# Patient Record
Sex: Female | Born: 1945 | Race: White | Hispanic: No | Smoking: Light tobacco smoker
Health system: Southern US, Community
[De-identification: ages and names within clinical notes are randomized; demographics above are authoritative.]

## PROBLEM LIST (undated history)

## (undated) DIAGNOSIS — K219 Gastro-esophageal reflux disease without esophagitis: Secondary | ICD-10-CM

## (undated) DIAGNOSIS — I219 Acute myocardial infarction, unspecified: Secondary | ICD-10-CM

## (undated) DIAGNOSIS — R0602 Shortness of breath: Secondary | ICD-10-CM

## (undated) DIAGNOSIS — T50905A Adverse effect of unspecified drugs, medicaments and biological substances, initial encounter: Secondary | ICD-10-CM

## (undated) DIAGNOSIS — K829 Disease of gallbladder, unspecified: Secondary | ICD-10-CM

## (undated) DIAGNOSIS — R001 Bradycardia, unspecified: Secondary | ICD-10-CM

## (undated) DIAGNOSIS — E785 Hyperlipidemia, unspecified: Secondary | ICD-10-CM

## (undated) DIAGNOSIS — I2510 Atherosclerotic heart disease of native coronary artery without angina pectoris: Secondary | ICD-10-CM

## (undated) DIAGNOSIS — R29898 Other symptoms and signs involving the musculoskeletal system: Secondary | ICD-10-CM

## (undated) DIAGNOSIS — I639 Cerebral infarction, unspecified: Secondary | ICD-10-CM

## (undated) DIAGNOSIS — I480 Paroxysmal atrial fibrillation: Secondary | ICD-10-CM

## (undated) DIAGNOSIS — I10 Essential (primary) hypertension: Secondary | ICD-10-CM

## (undated) DIAGNOSIS — I672 Cerebral atherosclerosis: Secondary | ICD-10-CM

## (undated) DIAGNOSIS — J449 Chronic obstructive pulmonary disease, unspecified: Secondary | ICD-10-CM

## (undated) HISTORY — DX: Hyperlipidemia, unspecified: E78.5

## (undated) HISTORY — PX: ABDOMINAL HYSTERECTOMY: SHX81

## (undated) HISTORY — DX: Acute myocardial infarction, unspecified: I21.9

## (undated) HISTORY — DX: Other symptoms and signs involving the musculoskeletal system: R29.898

## (undated) HISTORY — DX: Gastro-esophageal reflux disease without esophagitis: K21.9

## (undated) HISTORY — DX: Adverse effect of unspecified drugs, medicaments and biological substances, initial encounter: T50.905A

## (undated) HISTORY — DX: Essential (primary) hypertension: I10

## (undated) HISTORY — DX: Cerebral atherosclerosis: I67.2

## (undated) HISTORY — DX: Disease of gallbladder, unspecified: K82.9

## (undated) HISTORY — DX: Chronic obstructive pulmonary disease, unspecified: J44.9

## (undated) HISTORY — DX: Paroxysmal atrial fibrillation: I48.0

## (undated) HISTORY — DX: Bradycardia, unspecified: R00.1

---

## 1997-08-29 ENCOUNTER — Encounter: Admission: RE | Admit: 1997-08-29 | Discharge: 1997-11-27 | Payer: Self-pay | Admitting: Internal Medicine

## 1997-11-26 ENCOUNTER — Observation Stay (HOSPITAL_COMMUNITY): Admission: RE | Admit: 1997-11-26 | Discharge: 1997-11-27 | Payer: Self-pay | Admitting: Orthopedic Surgery

## 1998-09-03 ENCOUNTER — Other Ambulatory Visit: Admission: RE | Admit: 1998-09-03 | Discharge: 1998-09-03 | Payer: Self-pay | Admitting: Gynecology

## 1999-08-25 ENCOUNTER — Other Ambulatory Visit: Admission: RE | Admit: 1999-08-25 | Discharge: 1999-08-25 | Payer: Self-pay | Admitting: Gynecology

## 1999-08-25 ENCOUNTER — Encounter: Admission: RE | Admit: 1999-08-25 | Discharge: 1999-08-25 | Payer: Self-pay | Admitting: Gynecology

## 1999-08-25 ENCOUNTER — Encounter: Payer: Self-pay | Admitting: Gynecology

## 2000-08-29 ENCOUNTER — Encounter: Payer: Self-pay | Admitting: Gynecology

## 2000-08-29 ENCOUNTER — Encounter: Admission: RE | Admit: 2000-08-29 | Discharge: 2000-08-29 | Payer: Self-pay | Admitting: Gynecology

## 2000-08-29 ENCOUNTER — Other Ambulatory Visit: Admission: RE | Admit: 2000-08-29 | Discharge: 2000-08-29 | Payer: Self-pay | Admitting: Gynecology

## 2002-09-26 ENCOUNTER — Inpatient Hospital Stay (HOSPITAL_COMMUNITY): Admission: EM | Admit: 2002-09-26 | Discharge: 2002-09-28 | Payer: Self-pay | Admitting: Emergency Medicine

## 2002-09-26 ENCOUNTER — Encounter: Payer: Self-pay | Admitting: Emergency Medicine

## 2002-09-27 ENCOUNTER — Encounter: Payer: Self-pay | Admitting: Cardiovascular Disease

## 2002-10-31 ENCOUNTER — Encounter: Payer: Self-pay | Admitting: Cardiology

## 2002-10-31 ENCOUNTER — Encounter: Admission: RE | Admit: 2002-10-31 | Discharge: 2002-10-31 | Payer: Self-pay | Admitting: Cardiology

## 2002-11-06 ENCOUNTER — Ambulatory Visit (HOSPITAL_COMMUNITY): Admission: RE | Admit: 2002-11-06 | Discharge: 2002-11-06 | Payer: Self-pay | Admitting: Cardiology

## 2003-01-03 ENCOUNTER — Emergency Department (HOSPITAL_COMMUNITY): Admission: AD | Admit: 2003-01-03 | Discharge: 2003-01-03 | Payer: Self-pay | Admitting: Family Medicine

## 2003-06-21 ENCOUNTER — Emergency Department (HOSPITAL_COMMUNITY): Admission: EM | Admit: 2003-06-21 | Discharge: 2003-06-21 | Payer: Self-pay | Admitting: Family Medicine

## 2003-06-30 ENCOUNTER — Emergency Department (HOSPITAL_COMMUNITY): Admission: EM | Admit: 2003-06-30 | Discharge: 2003-06-30 | Payer: Self-pay | Admitting: Family Medicine

## 2003-12-17 ENCOUNTER — Encounter: Admission: RE | Admit: 2003-12-17 | Discharge: 2003-12-17 | Payer: Self-pay | Admitting: Gynecology

## 2003-12-17 ENCOUNTER — Other Ambulatory Visit: Admission: RE | Admit: 2003-12-17 | Discharge: 2003-12-17 | Payer: Self-pay | Admitting: Gynecology

## 2004-01-20 DIAGNOSIS — R29898 Other symptoms and signs involving the musculoskeletal system: Secondary | ICD-10-CM

## 2004-01-20 HISTORY — DX: Other symptoms and signs involving the musculoskeletal system: R29.898

## 2004-02-02 ENCOUNTER — Encounter: Admission: RE | Admit: 2004-02-02 | Discharge: 2004-02-02 | Payer: Self-pay | Admitting: Neurology

## 2004-08-12 ENCOUNTER — Emergency Department (HOSPITAL_COMMUNITY): Admission: EM | Admit: 2004-08-12 | Discharge: 2004-08-12 | Payer: Self-pay | Admitting: Family Medicine

## 2004-09-04 ENCOUNTER — Emergency Department (HOSPITAL_COMMUNITY): Admission: EM | Admit: 2004-09-04 | Discharge: 2004-09-04 | Payer: Self-pay | Admitting: Family Medicine

## 2004-09-21 ENCOUNTER — Emergency Department (HOSPITAL_COMMUNITY): Admission: EM | Admit: 2004-09-21 | Discharge: 2004-09-21 | Payer: Self-pay | Admitting: Family Medicine

## 2004-10-10 ENCOUNTER — Emergency Department (HOSPITAL_COMMUNITY): Admission: EM | Admit: 2004-10-10 | Discharge: 2004-10-10 | Payer: Self-pay | Admitting: Family Medicine

## 2004-10-15 ENCOUNTER — Emergency Department (HOSPITAL_COMMUNITY): Admission: EM | Admit: 2004-10-15 | Discharge: 2004-10-15 | Payer: Self-pay | Admitting: Family Medicine

## 2005-05-25 ENCOUNTER — Other Ambulatory Visit: Admission: RE | Admit: 2005-05-25 | Discharge: 2005-05-25 | Payer: Self-pay | Admitting: Gynecology

## 2006-08-30 ENCOUNTER — Encounter: Admission: RE | Admit: 2006-08-30 | Discharge: 2006-08-30 | Payer: Self-pay | Admitting: Gynecology

## 2006-08-30 ENCOUNTER — Other Ambulatory Visit: Admission: RE | Admit: 2006-08-30 | Discharge: 2006-08-30 | Payer: Self-pay | Admitting: Gynecology

## 2007-09-06 ENCOUNTER — Encounter: Admission: RE | Admit: 2007-09-06 | Discharge: 2007-09-06 | Payer: Self-pay | Admitting: Gynecology

## 2007-09-15 ENCOUNTER — Encounter: Admission: RE | Admit: 2007-09-15 | Discharge: 2007-09-15 | Payer: Self-pay | Admitting: Gynecology

## 2008-01-16 HISTORY — PX: CORONARY ARTERY BYPASS GRAFT: SHX141

## 2008-01-26 ENCOUNTER — Encounter: Admission: RE | Admit: 2008-01-26 | Discharge: 2008-01-26 | Payer: Self-pay | Admitting: Cardiology

## 2008-01-30 ENCOUNTER — Ambulatory Visit (HOSPITAL_COMMUNITY): Admission: RE | Admit: 2008-01-30 | Discharge: 2008-01-30 | Payer: Self-pay | Admitting: Cardiology

## 2008-01-30 HISTORY — PX: CARDIAC CATHETERIZATION: SHX172

## 2008-01-31 ENCOUNTER — Encounter: Payer: Self-pay | Admitting: Cardiothoracic Surgery

## 2008-01-31 ENCOUNTER — Ambulatory Visit (HOSPITAL_COMMUNITY): Admission: RE | Admit: 2008-01-31 | Discharge: 2008-01-31 | Payer: Self-pay | Admitting: Cardiothoracic Surgery

## 2008-01-31 ENCOUNTER — Ambulatory Visit: Payer: Self-pay | Admitting: Vascular Surgery

## 2008-02-02 ENCOUNTER — Ambulatory Visit: Payer: Self-pay | Admitting: Cardiothoracic Surgery

## 2008-02-07 ENCOUNTER — Inpatient Hospital Stay (HOSPITAL_COMMUNITY): Admission: RE | Admit: 2008-02-07 | Discharge: 2008-02-12 | Payer: Self-pay | Admitting: Cardiothoracic Surgery

## 2008-02-07 ENCOUNTER — Ambulatory Visit: Payer: Self-pay | Admitting: Cardiothoracic Surgery

## 2008-02-07 HISTORY — PX: TEE WITHOUT CARDIOVERSION: SHX5443

## 2008-02-14 ENCOUNTER — Ambulatory Visit: Payer: Self-pay | Admitting: Cardiothoracic Surgery

## 2008-03-01 ENCOUNTER — Encounter: Admission: RE | Admit: 2008-03-01 | Discharge: 2008-03-01 | Payer: Self-pay | Admitting: Cardiothoracic Surgery

## 2008-03-01 ENCOUNTER — Ambulatory Visit: Payer: Self-pay | Admitting: Cardiothoracic Surgery

## 2008-11-28 ENCOUNTER — Encounter: Admission: RE | Admit: 2008-11-28 | Discharge: 2008-11-28 | Payer: Self-pay | Admitting: Gastroenterology

## 2009-12-14 IMAGING — CR DG CHEST 1V PORT
1 series · 1 of 1 positions shown · non-contrast
Comparison: 02/07/2008

CLINICAL DATA: Status post CABG

PORTABLE CHEST - 1 VIEW

[view not recorded]
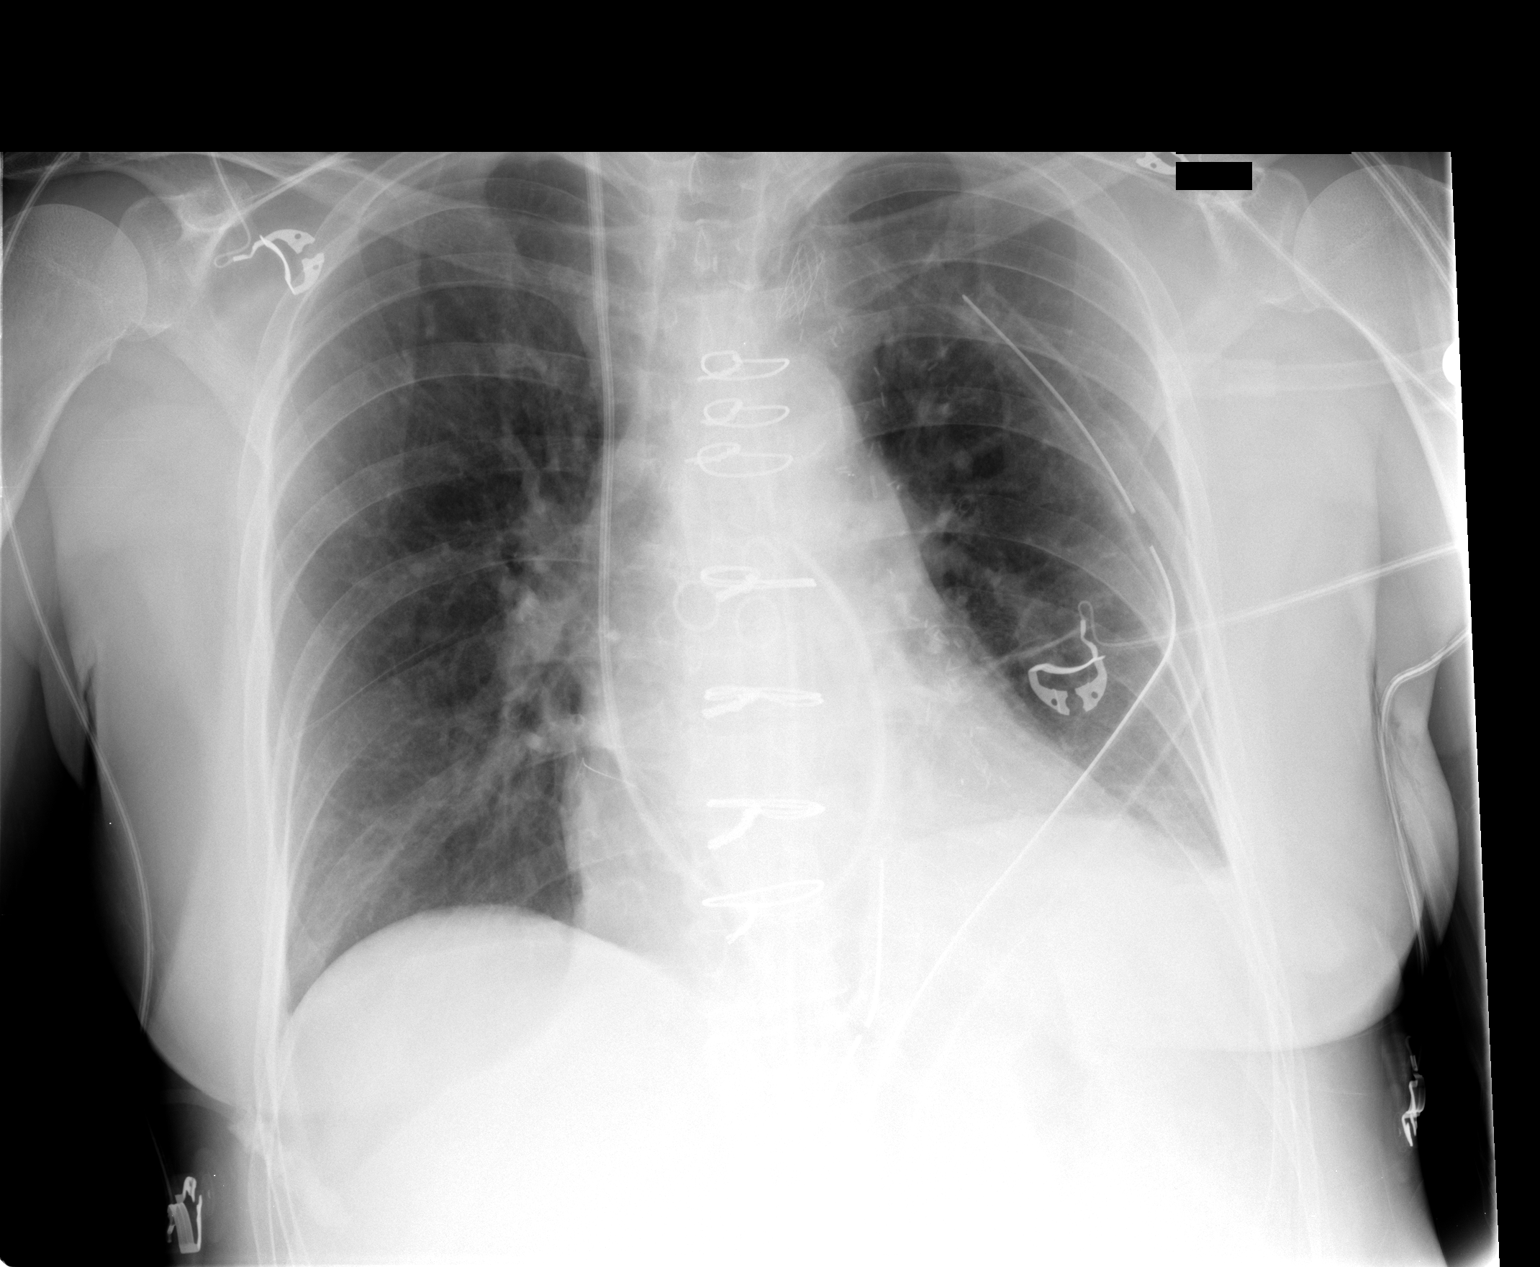

[1 of 1 positions shown; findings below may reference images not displayed]

FINDINGS: There is a left-sided chest tube.

No pneumothorax noted.

Swan-Ganz catheter tip is in the main pulmonary artery.

There is a mediastinal drain.

Left subclavian stent is identified.

Heart size is normal.

No pleural effusions identified.  There is no interstitial edema.
IMPRESSION: 1.  Stable support apparatus.
2.  No complications after removal of endotracheal tube.

## 2009-12-15 IMAGING — CR DG CHEST 1V PORT
1 series · 1 of 1 positions shown · non-contrast
Comparison: Portable chest x-ray of 02/09/2008

CLINICAL DATA: Coronary artery disease, for chest tube removal

PORTABLE CHEST - 1 VIEW

[view not recorded]
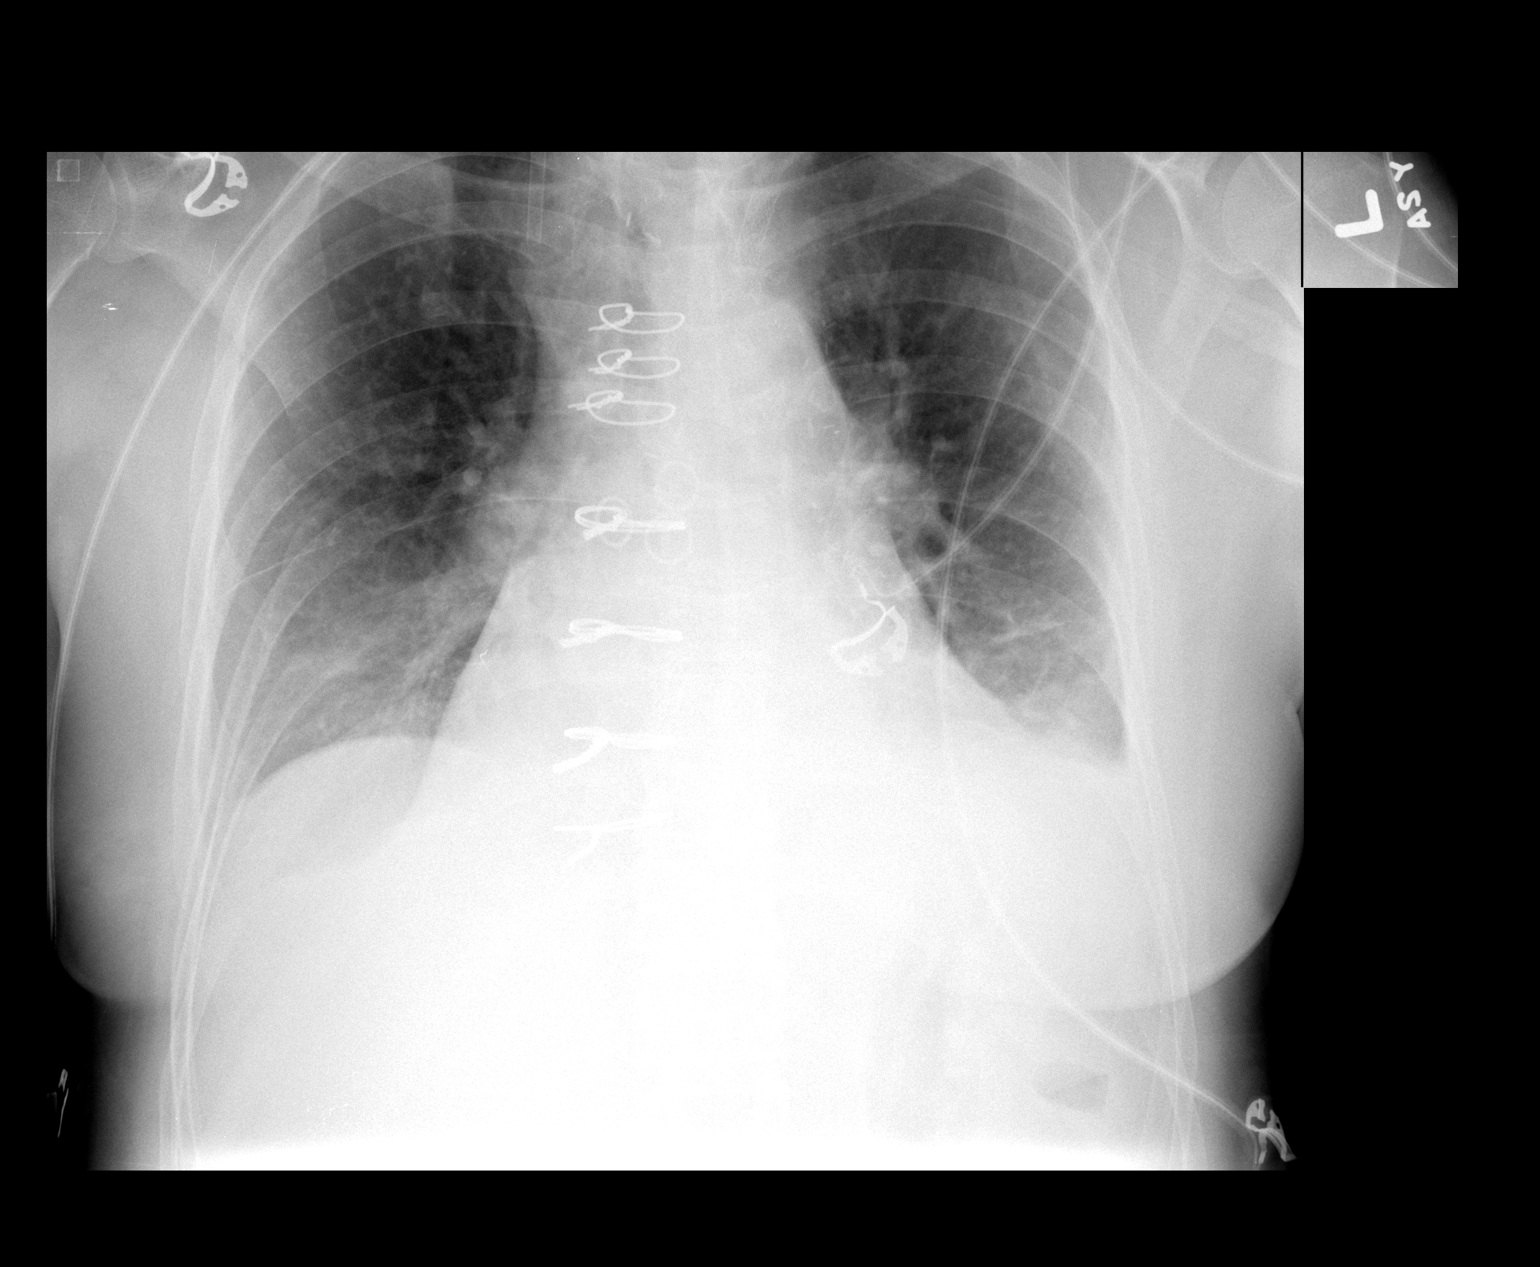

[1 of 1 positions shown; findings below may reference images not displayed]

FINDINGS: The left chest tube has been removed and there has been
no change in the small left apical pneumothorax.  A right venous
sheath remains in the SVC and there does appear to be a tiny right
apical pneumothorax present.  Mild basilar atelectasis and probable
effusions remain.  Cardiomegaly is stable.
IMPRESSION: 1.  No change in small left apical pneumothorax after removal of
left chest tube.
2.  Tiny right apical pneumothorax.

## 2009-12-15 IMAGING — CR DG CHEST 1V PORT
1 series · 1 of 1 positions shown · non-contrast
Comparison: 02/08/2008

CLINICAL DATA: Status post CABG.  Left-sided chest tube.

PORTABLE CHEST - 1 VIEW

[view not recorded]
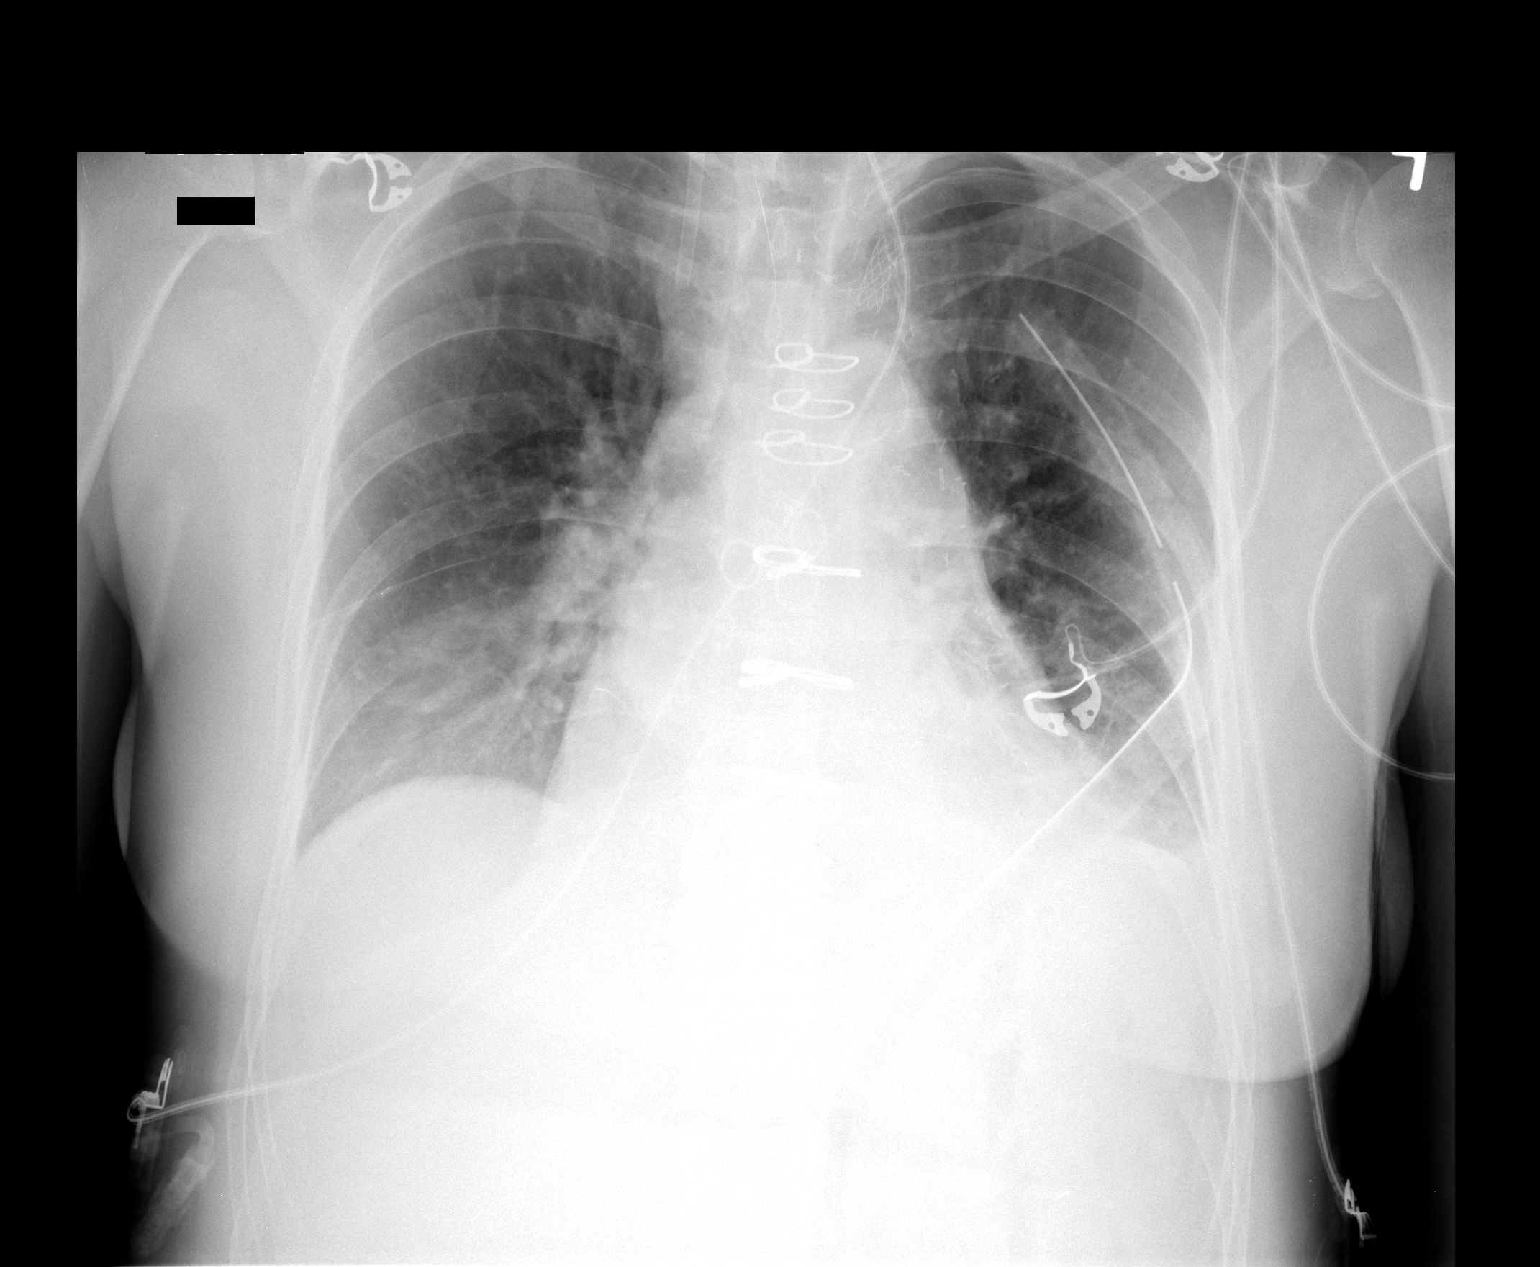

[1 of 1 positions shown; findings below may reference images not displayed]

FINDINGS: There is a new small left apical pneumothorax.

The Swan-Ganz catheter has been removed but the sheath remains.
Left-sided chest tube remains in good position.

There are small effusions with slight atelectasis at the left base.
There is mild pulmonary vascular prominence.
IMPRESSION: New tiny left apical pneumothorax.

New small effusions.

## 2009-12-16 IMAGING — CR DG CHEST 2V
2 series · 2 of 2 positions shown · non-contrast
Comparison: 02/09/2008

CLINICAL DATA: Postop coronary artery bypass grafting.  Follow-up
pneumothorax and pleural effusions.

CHEST - 2 VIEW

[w chest pa]
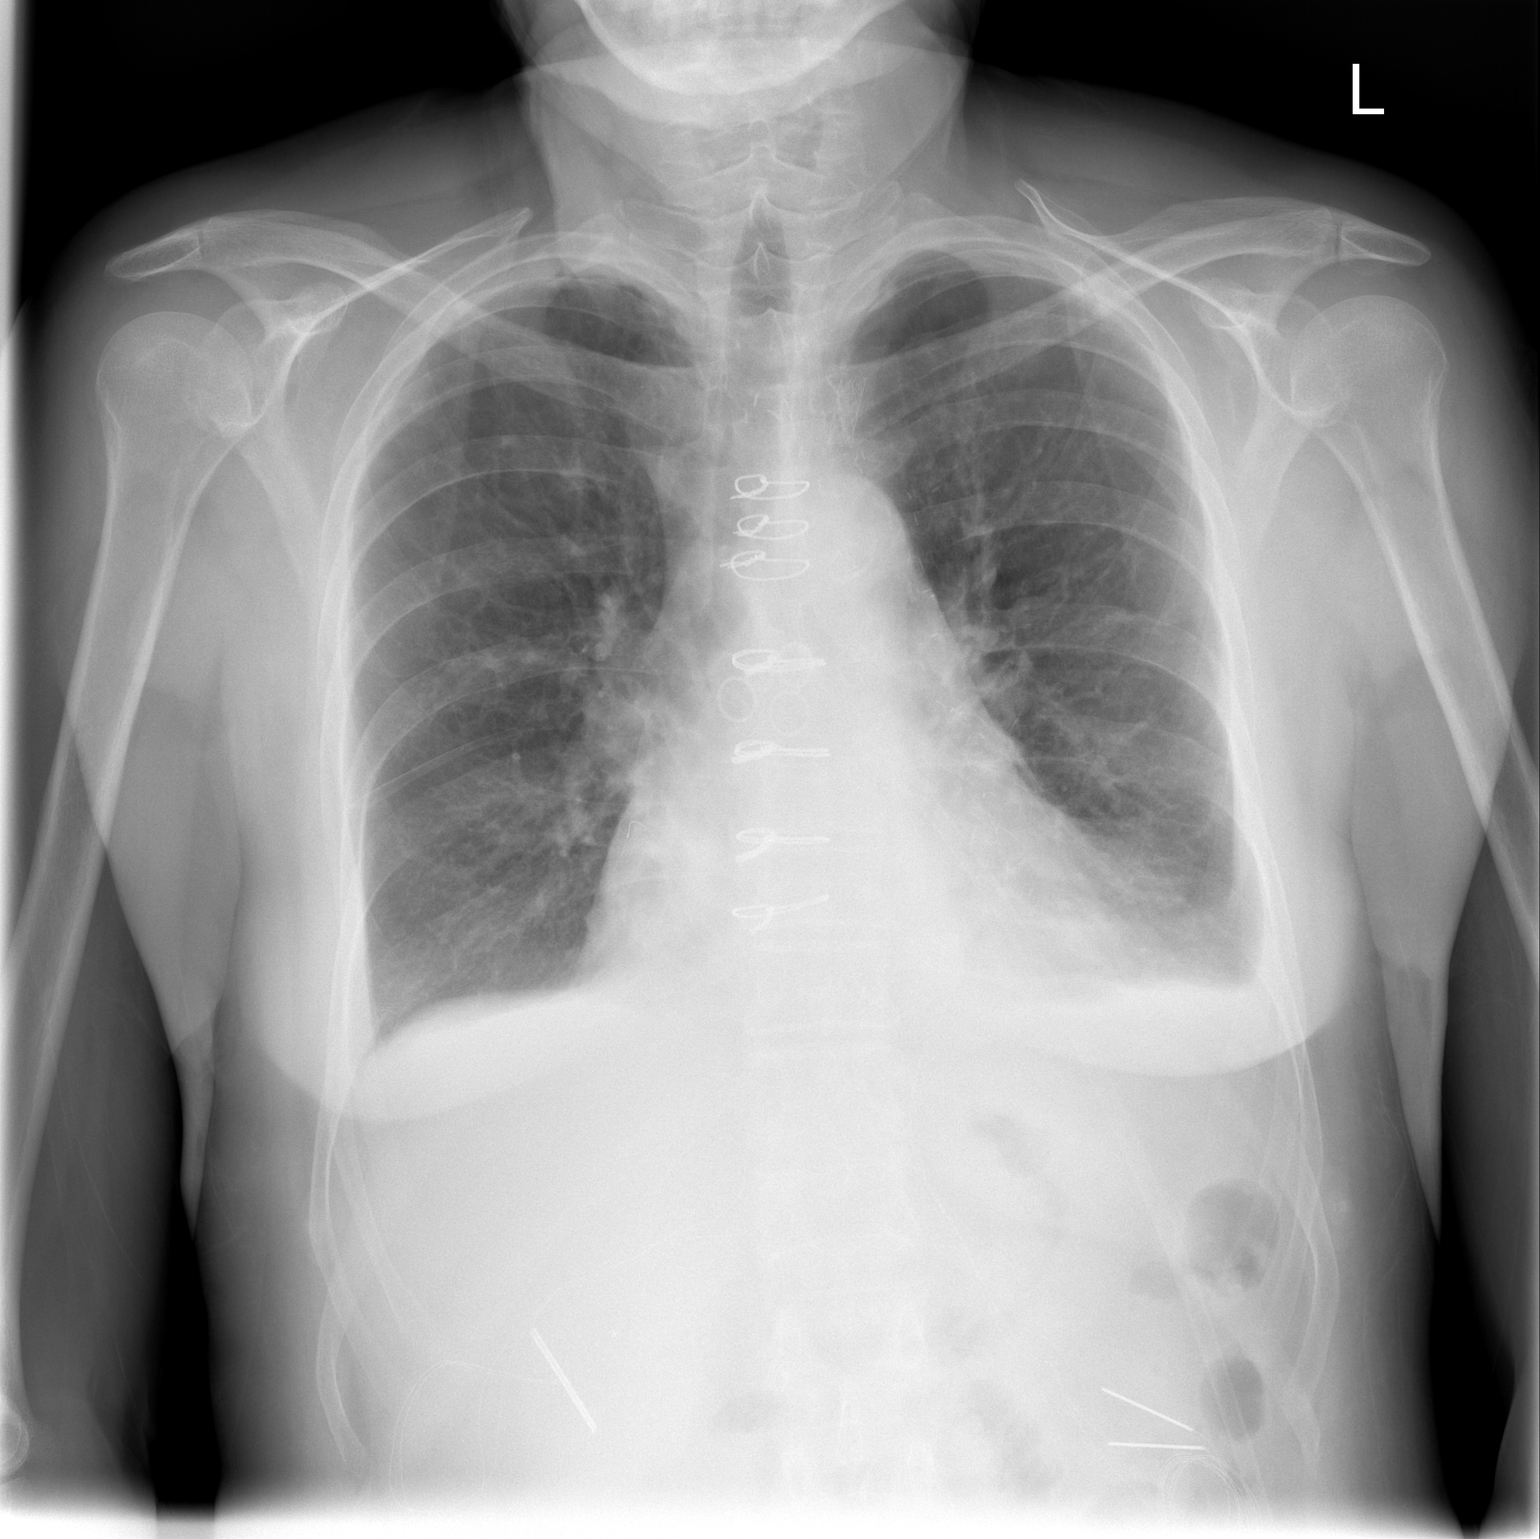

[w chest lat]
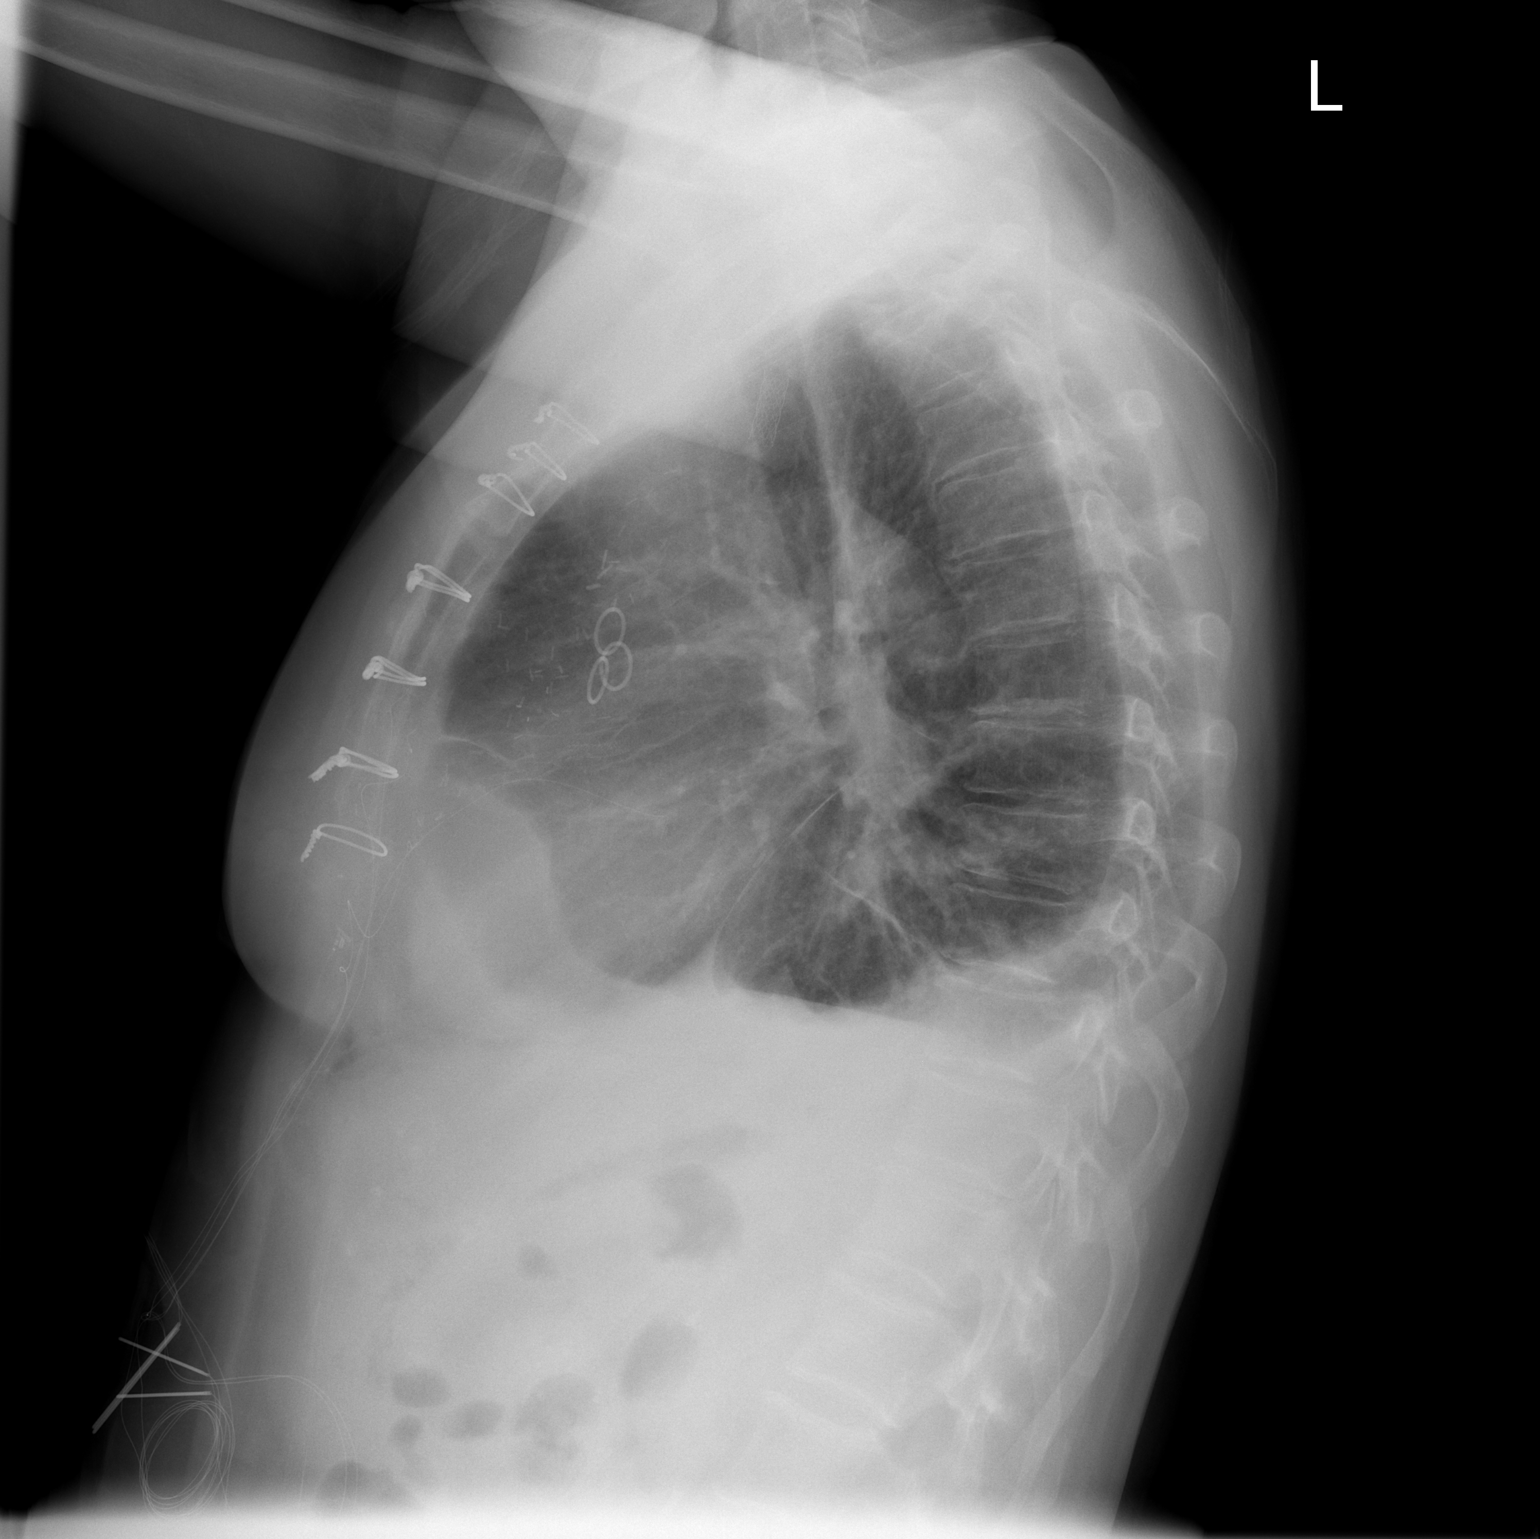

[2 of 2 positions shown; findings below may reference images not displayed]

FINDINGS: Improved aeration is noted in both lung bases.  Small
bilateral pleural effusions are again noted.  Tiny less than 5%
apical pneumothoraces are again seen bilaterally, which are not
significantly changed in size.  Cardiomegaly is stable.
IMPRESSION: 1.  Improved aeration of both lung bases.  Persistent small
bilateral pleural effusions.
2.  Stable tiny, less than 5%, biapical pneumothoraces.

## 2010-01-26 ENCOUNTER — Encounter
Admission: RE | Admit: 2010-01-26 | Discharge: 2010-01-26 | Payer: Self-pay | Source: Home / Self Care | Attending: Gastroenterology | Admitting: Gastroenterology

## 2010-02-19 DIAGNOSIS — I672 Cerebral atherosclerosis: Secondary | ICD-10-CM

## 2010-02-19 HISTORY — DX: Cerebral atherosclerosis: I67.2

## 2010-04-27 ENCOUNTER — Ambulatory Visit (HOSPITAL_COMMUNITY): Admission: RE | Admit: 2010-04-27 | Payer: Self-pay | Source: Ambulatory Visit | Admitting: General Surgery

## 2010-06-30 NOTE — Consult Note (Signed)
NEW PATIENT CONSULTATION   JESSICAMARIE, AMIRI  DOB:  11/24/1945                                        February 02, 2008  CHART #:  [id number]   PHYSICIAN REQUESTING CONSULTATION:  Thereasa Solo. Little, MD   PRIMARY CARE PHYSICIAN:  Elvina Sidle, MD   REASON FOR CONSULTATION:  Severe multivessel coronary artery disease  with class III progressive angina.   CHIEF COMPLAINT:  Chest pain and shortness of breath.   HISTORY OF PRESENT ILLNESS:  I was asked to evaluate this 65 year old  white female smoker for potential multivessel coronary artery bypass  surgery for recently diagnosed severe three-vessel coronary artery  disease with mild reduction in LV function.  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  In 2004, she had PCI  with 2 stents placed in the LAD by Dr. Elsie Lincoln.  Around that time, she  also had a subclavian stent placed in the left subclavian artery for  subclavian steal syndrome.  She has been on chronic Plavix therapy.  For  the past 3 months, she has been having exertional retrosternal chest  pain radiating to her jaw and neck usually associated with stress or  increased activity, which last a few minutes.  She has had no resting  symptoms.  A stress perfusion scan showed evidence of anterior and  inferior ischemia.  She subsequently underwent diagnostic cardiac cath  by Dr. Clarene Duke which demonstrated chronic occlusion of right coronary  artery, high-grade stenosis of the LAD and between the two stents of  approximately 80% and a 90% stenosis of the large first marginal.  Ejection fraction was 40%-45% and LVEDP was 19 mmHg.  The inferior wall  was moderately hypokinetic.  Based on her coronary anatomy and symptoms,  she is felt to be a candidate for surgical revascularization.  Plavix  was held after the cardiac cath 2 days ago.   PAST MEDICAL  HISTORY:  1. Hypertension.  2. Chronic bronchitis with active smoking.  3. Intolerance to statin drugs and codeine.   HOME MEDICATIONS:  Micardis 40/12.5 mg daily, Plavix 75 mg a day (on  hold), aspirin 81 mg daily, lorazepam 0.5 mg p.r.n., Zoloft 50 mg a day,  and Prilosec 20 mg a day.   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  She smokes 5-10 cigarettes a day.  She does not  drink alcohol.  She is widowed.   FAMILY HISTORY:  Negative for diabetes.  Positive for coronary artery  disease.   REVIEW OF SYSTEMS:  No change in weight or fever.  No active dental  complaints or difficulty swallowing.  No history of chest trauma or  abnormal chest x-ray.  Vascular review is negative for DVT,  claudication, or TIA.  Her pre-CABG Doppler showed no significant  carotid disease.  She has equal blood pressures in each upper extremity  following the subclavian stent on the left.  There is no history of  diabetes or thyroid disease.  GI review is positive for some reflux  symptoms, but no blood per rectum, hepatitis, or jaundice.  Urologic  review is negative for kidney stones.  Neurologic review is negative for  stroke  or seizure.  She does have some minimal numbness in her left hand  intermittently.   PHYSICAL EXAMINATION:  VITAL SIGNS:  The patient is 5 feet 3, weighs 153  pounds, blood pressure 120/70, pulse 80, respirations 18, and saturation  99% on room air.  GENERAL:  She is alert and pleasant and in no distress.  HEENT:  Normocephalic.  Dentition good.  NECK:  Without JVD, mass, or bruit.  LYMPHATICS:  No palpable supraclavicular or cervical adenopathy.  LUNGS:  Breath sounds are with scattered rhonchi.  CHEST:  There is no thoracic deformity.  CARDIAC:  Regular rhythm without S3, gallop, murmur, or rub.  EXTREMITIES:  The peripheral pulses are intact in all extremities.  There is no peripheral edema.  ABDOMEN:  Soft, nontender  without pulsatile mass.  NEUROLOGIC:  Nonfocal.   LABORATORY DATA:  Reviewed the coronary arteriograms recently performed  by Dr. Clarene Duke and she has adequate targets for grafting in the LAD, OM,  and diagonal vessels and potentially graftable distal posterior  descending vessel, although this vessel was not visualized well via  collaterals.  She has inferior wall hypokinesia with EF of 45%.   PLAN:  The patient will continue her Plavix washout.  She will be  scheduled for surgical revascularization on Wednesday February 07, 2008.  I have discussed the details of surgery in full with the patient  including the indications, benefits, risks, and alternatives.  She  understands and agrees to proceed with surgery and we will proceed as  scheduled.   Kerin Perna, M.D.  Electronically Signed   PV/MEDQ  D:  02/02/2008  T:  02/03/2008  Job:  [id number]

## 2010-06-30 NOTE — Assessment & Plan Note (Signed)
OFFICE VISIT   Denise [name], Denise [name]  DOB:  [birth year]                                        March 01, 2008  CHART #:  [id number]   CURRENT PROBLEMS:  1. Status post coronary artery bypass graft x4 on February 07, 2008,      for class IV unstable angina with severe three-vessel coronary      artery disease.  2. Chronic obstructive pulmonary disease with current smoking.  3. Intolerance to statins.  4. Postoperative atrial fibrillation, on Coumadin and beta-blocker.   PRESENT ILLNESS:  The patient is a 65 year old smoker, who presented  with unstable angina, who was found by cath to have severe multivessel  coronary artery disease.  She underwent left IMA grafting to LAD and  vein grafts to the diagonal OM and right coronary on February 07, 2008.  She did well following surgery and was discharged home in sinus rhythm.  Following her return home, she developed tachyarrhythmias and was placed  on Coumadin and Bystolic by Dr. Clarene Duke.  The Toprol-XL was discontinued.  She is back in a regular rhythm now without palpitations.  Unfortunately, she is still smoking up to 5 cigarettes a day.  She  denies any recurrent angina and the surgical incisions are healing well.  Her other current medications include aspirin, Lexapro, iron, nicotine  patch, and p.r.n. pain medication.   PHYSICAL EXAMINATION:  VITAL SIGNS:  Blood pressure 150/80, pulse 70,  respirations 18, and saturation 98%.  GENERAL:  She is alert and pleasant.  LUNGS:  Breath sounds are clear and equal.  CHEST:  The sternum is stable and well healed.  CARDIAC:  Rhythm is regular without S3 gallop or murmur.  EXTREMITIES:  The leg incisions are well healed.  There is no peripheral  edema.   PA and lateral chest x-ray taken today shows clear lung fields with  stable cardiac silhouette and sternal wires intact.  There is mild  bibasilar atelectasis.   IMPRESSION AND PLAN:  The patient has done well now  between 3-4 weeks  postop.  She is ready to start driving and routine household activities.  She should be able to start her cardiac rehab later this month and I  gave her return to work slip for mid February.  I told her the  importance of total smoking cessation and taking one aspirin a day  indefinitely.  She will return as needed.   Kerin Perna, M.D.  Electronically Signed   PV/MEDQ  D:  03/01/2008  T:  03/01/2008  Job:  [id number]   cc:   Thereasa Solo. Little, M.D.  Elvina Sidle, M.D.

## 2010-06-30 NOTE — Discharge Summary (Signed)
NAMEDESTENEE, GUERRY NO.:  [id number]   MEDICAL RECORD NO.:  [id number]          PATIENT TYPE:  INP   LOCATION:  2016                         FACILITY:  [facility]   PHYSICIAN:  Kerin Perna, M.D.  DATE OF BIRTH:  Apr 06, 1945   DATE OF ADMISSION:  02/07/2008  DATE OF DISCHARGE:  02/11/2008                               DISCHARGE SUMMARY   ADMITTING DIAGNOSES:  1. History of coronary artery disease (status post percutaneous      transluminal coronary angioplasty with stents in 2004, on chronic      Plavix).  2. History of hypertension.  3. History of chronic bronchitis.  4. Tobacco abuse.   DISCHARGE DIAGNOSES:  1. History of coronary artery disease (status post percutaneous      transluminal coronary angioplasty with stents in 2004, on chronic      Plavix).  2. History of hypertension.  3. History of chronic bronchitis.  4. Tobacco abuse.  5. Postoperative acute blood loss anemia (last hemoglobin and      hematocrit 9.3 and 28.2).  6. Thrombocytopenia (last platelet count to 126,000).   PROCEDURES:  Coronary artery bypass grafting surgery x4 (LIMA to LAD,  SVG to diagonal, SVG to circumflex marginal, and SVG to RCA with [facility] of  the right lower extremity done on February 07, 2008, by Dr. Donata Clay.   HISTORY OF PRESENTING ILLNESS:  This is a pleasant 65 year old Caucasian  female with known coronary artery disease (status post PTCA with stents  in 2004), history of hypertension, history of chronic bronchitis, and  history of tobacco abuse, who was originally seen in the office by Dr.  Donata Clay on December 03, 2007.  At that time, the patient had for the  prior 3 months been complaining of exertional retrosternal chest pain  radiating to her jaw and neck.  This usually occurred with increased  activity or stress and lasted a few minutes.  She had no resting  symptoms.  A stress perfusion scan showed evidence of anterior and  inferior ischemia.  She then  underwent a cardiac catheterization on  January 20, 2008, with Dr. Clarene Duke, which revealed the EF to be 40-45%,  chronic occlusion of the RCA, high-grade stenosis of the LAD, first  marginal of the circumflex system with diffusely and severely diseased  in the proximal third (80-90% sequential narrowing) and the inferior  wall was mildly hypokinetic.  The patient's Plavix had been stopped  since 2 days prior to seeing Dr. Donata Clay in the office on February 02, 2008.  The patient was admitted to [facility] to undergo the  aforementioned coronary artery bypass grafting surgery x4 on February 07, 2008.   BRIEF HOSPITAL COURSE STAY:  The patient was extubated the evening of  surgery.  She remained afebrile and hemodynamically stable.  She was  volume overloaded and diuresed accordingly.  All chest tubes were  removed by February 09, 2008.  Follow up chest x-ray revealed a small  left apical pneumothorax, mild basilar atelectasis, cardiomegaly, and  small bilateral pleural effusions.  The patient was  transferred to the  Intensive Care Unit to [facility] for further convalescence.  Currently on  postop day #4, the patient had T-max of 99.5, later became afebrile.  BP  93/57 and O2 saturation 90-93% on room air.  Preop weight was 70 kg,  today's weight down to 69.8 kg.  CBGs 113, 128, and 110 respectively.   PHYSICAL EXAMINATION:  CARDIOVASCULAR:  Regular rate and rhythm.  PULMONARY:  Clear to auscultation bilaterally.  No rales, wheezes, or  rhonchi.  Slightly diminished at bases, however.  ABDOMEN:  Soft and  nontender.  Bowel sounds present.  EXTREMITIES:  Trace edema of the lower extremities.  SKINS:  Wounds, sternal and lower incision wounds are clean, dry, and  continuing to heal.   Throughout the patient remains afebrile and hemodynamically stable.  She  will be discharged on February 11, 2008.   LATEST LABORATORY STUDIES:  Reveal the following:  Last BMET done  February 10, 2008,  potassium 3.98 and BUN and creatinine 9 and 0.66  respectively.  A CBC also done on this date, white blood cell count down  to 11,500, H and H of 9.3 and 28.2 respectively, and platelet count  126,000.  Last chest x-ray done February 10, 2008, revealed a stable  less than 5% (bilateral) apical pneumothoraces, improved aeration of the  lung bases, and persistent small bilateral pleural effusions.   DISCHARGE INSTRUCTIONS:  The patient is to remain on low-fat, low-salt,  carbohydrate modified medium caloric diet.  She is not to drive or lift  more than 10 pounds.  She is to continue with the breathing exercise  daily.  She is to walk every day.  She is to increase her frequency and  duration as tolerated.  She may shower.  She may cleanse her with wounds  with mild soap and water.  She is to call the office if any wound  problems arise.  She is to stop smoking and instructed on the importance  of this.   FOLLOWUP APPOINTMENTS:  1. The patient needs to follow up with Dr. Milus Glazier, PCP, regarding      preoperative hemoglobin A1c of 6.5.  2. The patient is to call for follow appointment to see Dr. Clarene Duke in      2 weeks.  3. [facility] office will contact the patient      regarding the followup appointment with Dr. Donata Clay for 3 weeks      prior to this appointment.  A chest x-ray will be obtained.   DISCHARGE MEDICATIONS:  1. Plavix 75 mg p.o. daily.  2. Omeprazole 20 mg p.o. daily.  3. Enteric-coated aspirin 81 mg p.o. daily.  4. Sertraline 50 mg p.o. daily.  5. Lopressor 12.5 mg p.o. 2 times daily.  6. NicoDerm CQ 21 mg/24-hour patch, remove old patch for applying new      one.  7. Oxycodone 5 mg 1-2 tablets p.o. q.4-6 h as needed for pain.  It will be determined at the time of discharge whether or not adjustment  has to be made to Lopressor depending on the patient's blood pressure  and in addition whether or not Lasix and potassium will be required.       Doree Fudge, [place]      Kerin Perna, M.D.  Electronically Signed    DZ/MEDQ  D:  02/10/2008  T:  02/10/2008  Job:  [id number]   cc:   Thereasa Solo. Little, M.D.  Elvina Sidle, M.D.

## 2010-06-30 NOTE — Op Note (Signed)
NAMEJASEY, CORTEZ                ACCOUNT NO.:  [id number]   MEDICAL RECORD NO.:  [id number]          PATIENT TYPE:  INP   LOCATION:  2304                         FACILITY:  [facility]   PHYSICIAN:  Bedelia Person, M.D.        DATE OF BIRTH:  [birth year]   DATE OF PROCEDURE:  02/07/2008  DATE OF DISCHARGE:                               OPERATIVE REPORT   INTRAOPERATIVE TRANSESOPHAGEAL ECHOCARDIOGRAPHY REPORT   HISTORY:  Ms. Zachery is a 65 year old female with known coronary artery  disease.  Cardiac cath showed significant inferior hypokinesis.  A TEE  will be used intraoperatively to assess the left ventricular function as  well as long mitral valve function with the inferior hypokinesis.  The  patient has no history of medical condition to the esophagus or the  stomach.   After induction of general anesthesia, the airway was secured with an  oral endotracheal tube.  An orogastric tube was placed to remove the  gastric contents and then removed.  The transesophageal probe was  heavily lubricated, placed in a sleeve, which was also heavily  lubricated, placed blindly down the oropharynx with no significant  resistance.  It was left in the neutral unflexed position at  approximately 40-cm depth throughout the case.  At the completion of the  case, the probe was removed.  There was no evidence of oral or  pharyngeal damage during the bypass.  The probe was left in the neutral  unflexed position.  Pre-bypass examination revealed left ventricle to be  normal size.  The contractility was good except for moderate inferior  hypokinesis.  The left atrium was normal size and appearance.  The  appendage was clean.  Septum was intact.  Right ventricle was normal  size and appearance.  Swan-Ganz catheter could be noted.  The mitral  valve leaflets were normal in appearance and function.  There was no  calcifications noted.  Valve coapted well in the valvular plane.  Color  Doppler revealed no mitral  insufficiency.  The aortic valve had 3  leaflets.  All leaflets opened and closed appropriately.  No  calcification was noted.  There was no aortic insufficiency.  The  tricuspid valve again was normal in appearance and function.  There was  a wisp of insufficiency with a Swan-Ganz catheter across the valve.  The  patient underwent coronary artery bypass grafting.  At completion of the  procedure, mild inotropic support was used.  Left ventricular function  was now dynamic with again no significant change and the left  ventricular inferior wall showing moderate hypokinesis.  Initial filling  pressures revealed very small left ventricular size, which improved with  volume.  Mitral valve showed no significant change from the pre-bypass  examination, and color Doppler again revealed no mitral insufficiency.  The remainder of the exam failed to show any significant change from the  pre-bypass examination.           ______________________________  Bedelia Person, M.D.     LK/MEDQ  D:  02/07/2008  T:  02/08/2008  Job:  [id number]

## 2010-06-30 NOTE — Cardiovascular Report (Signed)
NAMEYAILEN, [name]                ACCOUNT NO.:  [id number]   MEDICAL RECORD NO.:  [id number]          PATIENT TYPE:  OIB   LOCATION:  2899                         FACILITY:  [facility]   PHYSICIAN:  Thereasa Solo. Little, M.D. DATE OF BIRTH:  1945/08/10   DATE OF PROCEDURE:  01/20/2008  DATE OF DISCHARGE:  01/30/2008                            CARDIAC CATHETERIZATION   INDICATIONS FOR TEST:  This 65 year old female has known coronary artery  disease.  Her remote history of heart disease includes angioplasty in  acute setting to a totally occluded right coronary artery by Dr. Verdis Prime in 1998.  In 2004, she had 2 non-overlapping stents placed by Dr.  Elsie Lincoln to her LAD.  Prior to that, she had a left subclavian stent  placed by Dr. Alanda Amass for subclavian steal.   For 3 or 4 months, she has began having episodes of retrosternal chest  discomfort that radiates into her jaw, usually associated with emotional  stress, lasts about a minute.  There were no other symptoms.  She had a  nuclear study performed that was positive showing anteroinferior and  anterolateral ischemia.  Because of this, she is brought in for  outpatient cardiac catheterization.   After obtaining informed consent, the patient was prepped and draped in  the usual sterile fashion exposing the right groin.  Following local  anesthetic with 1% Xylocaine, the Seldinger technique was employed and a  5-French introducer sheath was placed in the right femoral artery.  Left  and right coronary arteriography evaluation of the left subclavian then  ventriculography and a distal aortogram was performed.   COMPLICATIONS:  None.   TOTAL CONTRAST USED:  95 mL.   EQUIPMENT:  5-French Judkins configuration catheters.   RESULTS:  1. Hemodynamic monitoring:  Her central aortic pressure was 169/79.      Her left ventricular pressure was 174/12, and at the time of      pullback across the aortic valve, there was no significant  gradient.  2. Ventriculography.  Ventriculography in the RAO projection using 20      mL of contrast at 12 mL per second revealed the posterior basilar      and inferior wall to be moderately hypokinetic.  The anterior wall      and the apex was mildly hypokinetic.  The ejection fraction was 40-      45% with an end-diastolic pressure of 19.  3. Distal aortogram.  A distal aortogram done above the level of the      renal arteries showed no evidence of renal artery stenosis.  There      was mild irregularity in the infrarenal aorta that extended all the      way down to the bifurcation.   CORONARY ARTERIOGRAPHY:  1. Left main had mild irregularities.  2. LAD:  The LAD had 2 non-overlapping stents.  The stents were      patent, but the vessel was subtotaled between the stents.  In      addition to that in the midportion of the LAD, there was another  tubular area of 70% narrowing.  The first diagonal was not involved      with the blockages but was distal to the subtotaled area.  3. Circumflex.  The circumflex in the AV groove was free of disease.      The first marginal, however, was diffusely and severely diseased in      the proximal third with areas of 80-90% sequentially.  The distal      vessel was graftable.  4. Right coronary artery.  The right coronary artery was functionally      occluded.  There was a large left-to-right collateral bed filling      the PDA and posterolateral vessel.   CONCLUSION:  1. Severe 3-vessel disease, needing bypass surgery.  2. A 40-45% ejection fraction.  3. Mild irregularities in the infrarenal segment of her aorta.  4. Hypertension.   Denise [name] has been on Plavix chronically.  Because of this, she cannot  have surgery done right away, but I have stopped her Plavix.  I have set  her up to see Dr. Donata Clay on Friday (3 days from now).  I have asked  her to limit her physical activities until this can be resolved with  bypass  surgery.           ______________________________  Thereasa Solo Little, M.D.     ABL/MEDQ  D:  01/30/2008  T:  01/30/2008  Job:  [id number]   cc:   Brett Canales A. Cleta Alberts, M.D.  [facility]  Cath Lab

## 2010-06-30 NOTE — Op Note (Signed)
NAMEKAZIAH, KRIZEK NO.:  [id number]   MEDICAL RECORD NO.:  [id number]          PATIENT TYPE:  INP   LOCATION:  2304                         FACILITY:  [facility]   PHYSICIAN:  Kerin Perna, M.D.  DATE OF BIRTH:  1945-10-06   DATE OF PROCEDURE:  02/07/2008  DATE OF DISCHARGE:                               OPERATIVE REPORT   OPERATION:  1. Coronary artery bypass grafting x4 (left internal mammary artery to      LAD, saphenous vein graft to diagonal, saphenous vein graft to      circumflex marginal, and saphenous vein graft to right coronary      artery).  2. Endoscopic harvest of right leg greater saphenous vein.   SURGEON:  Kerin Perna, MD   ASSISTANT:  Rowe Clack, PA-C   ANESTHESIA:  General.   PREOPERATIVE DIAGNOSIS:  Class IV unstable angina with severe  multivessel coronary artery disease.   POSTOPERATIVE DIAGNOSIS:  Class IV unstable angina with severe  multivessel coronary artery disease.   INDICATIONS:  The patient is a 65 year old hypertensive smoker with  exertional chest pain and a positive stress test.  Cardiac  catheterization by Dr. Clarene Duke demonstrated severe multivessel disease  with chronic occlusion of the right coronary and some inferior wall  hypokinesia with EF of 45%.  She was felt to be a candidate for  multivessel bypass grafting and was stopped on her Plavix following  cardiac catheterization.  I followed up with the patient in the office  and reviewed results of the cardiac catheterization with her and  discussed the indications and expected benefits of coronary artery  bypass surgery for treatment of her multivessel coronary artery disease.  I discussed the major issues of surgery including location of the  surgical incisions, the choice of conduit to include internal mammary  artery and endoscopically harvested vein, use of general anesthesia and  cardiopulmonary bypass, and expected postoperative recovery.  I  discussed with her the risks including risks of heart attack, stroke,  bleeding, blood transfusion requirement, prolonged ventilator dependence  and pneumonia, and death.  She understood that with her heavy  preoperative smoking history she would be at increased risk for  postoperative respiratory complications including prolonged oxygen  dependence, pneumonia, ventilator dependence, and a prolonged hospital  stay.  After reviewing these issues, she demonstrated her understanding  and agreed to proceed with surgery under what I felt was an informed  consent.  She did agree to stop smoking for several days prior to  surgery.   OPERATIVE FINDINGS:  1. Severe multivessel disease with adequate targets in the LAD and      circumflex distribution, but small and poor targets in the diagonal      and right coronary artery distribution.  2. Good quality conduit from the right leg endovein harvest and using      the left internal mammary artery.  3. Severe chronic lung disease from smoking.   PROCEDURE:  The patient was brought to the operative room and placed  supine on the operating table where general anesthesia was induced.  A  transesophageal 2-D echo probe was placed by the anesthesiologist, which  demonstrated some inferior wall dysfunction, but no significant valvular  abnormalities or patent foramen ovale.  The patient was prepped and  draped as a sterile field.  A sternal incision was made and the  saphenous vein was harvested endoscopically from the right leg.  The  left internal mammary artery was harvested as a pedicle graft from its  origin at the subclavian vessels.  It was a good vessel with good flow,  but measured proximal 1.3-1.4 mm in diameter.  The sternal retractor was  placed and the pericardium was opened and suspended.  After the vein was  harvested and inspected, the patient was given a full dose of heparin  and purse-strings were placed in the ascending aorta and right  atrium,  and the patient was cannulated and placed on bypass.  The coronaries  were identified for grafting and the mammary artery and vein grafts were  prepared for the distal anastomoses.  Cardioplegia catheters were placed  for both antegrade and retrograde cold blood cardioplegia.  The patient  was cooled to 32 degrees.  The aortic crossclamp was applied.  A 800 mL  of cold blood cardioplegia was delivered in split doses between the  antegrade and retrograde catheters.  There was good cardioplegic arrest  and septal temperature dropped less than 12 degrees.  Cardioplegia was  then delivered every 20 minutes or less while the crossclamp was  applied.   The distal coronary anastomoses were then performed.  The first distal  anastomosis was to the distal right coronary artery.  There was no  vessel to graft in the posterior descending or posterolateral  distribution.  The main right coronary in the AV groove was small  measuring 1.2 mm.  It was totally occluded proximally.  The reverse  saphenous vein was sewn end-to-side with running 7-0 Prolene with a  slow, but adequate flow through the vessel.  The second distal  anastomosis was the circumflex marginal branch of the left coronary.  This had a high-grade proximal stenosis of 95%.  A reverse saphenous  vein was sewn end-to-side with running 7-0 Prolene and there was  excellent flow through the graft.  The third distal anastomosis was to  the diagonal branch to the LAD.  This was heavily calcified with  proximal 80% stenosis.  Reverse saphenous vein was sewn as 1.2 mm vessel  and there was good flow through the graft.  Cardioplegia was redosed.  The fourth distal anastomosis was to the distal third of the LAD.  The  LAD was very heavily calcified along its length up until the point of  the anastomosis.  The left IMA pedicle was brought through an opening  created in the left lateral pericardium was brought down onto the LAD  and sewn  end-to-side with running 8-0 Prolene.  There was good flow  through the anastomosis after briefly releasing the pedicle bulldog on  the mammary artery.  The bulldog was reapplied and the pedicle was  secured at epicardium.  Cardioplegia was redosed.   While the crossclamp was still in place, 3 proximal vein anastomoses  were performed on the ascending aorta using a 4.0-mm punch running 7-0  Prolene.  Prior to tying down the final proximal anastomosis, air was  vented from the coronaries with a dose of retrograde warm blood  cardioplegia.   After the crossclamp was removed, the heart resumed a spontaneous  rhythm.  Best boy  was aspirated from the vein grafts with a 27 gauge needle.  The cardioplegia cannulas were removed.  The vein grafts were checked  and each had good flow and hemostasis was documented at the proximal and  distal anastomoses.  The patient was rewarmed to 37 degrees.  Temporary  pacing wires were applied.  After the patient was adequately reperfused  and rewarmed, she was weaned from bypass without difficulty after the  ventilator was resumed.  Cardiac output and blood pressure were stable.  The echo showed preserved LV function.  Protamine was administered  without adverse reaction.  The cannulas were removed.  The mediastinum  was irrigated with warm antibiotic irrigation.  Leg incision was  irrigated and closed in a standard fashion.  The superior pericardial  fat was closed over the aorta.  Two mediastinal and a left  pleural chest tube were placed and brought out through the separate  incisions.  The sternum was closed with interrupted steel wire.  Pectoralis fascia was closed using running #1 Vicryl.  Subcutaneous and  skin layers were closed in running Vicryl and sterile dressings were  applied.  Total bypass time was 133 minutes.      Kerin Perna, M.D.  Electronically Signed     PV/MEDQ  D:  02/07/2008  T:  02/08/2008  Job:  [id number]   cc:   Thereasa Solo.  Little, M.D.

## 2010-07-03 NOTE — Discharge Summary (Signed)
NAME:  Denise [name], Denise [name]                          ACCOUNT NO.:  [id number]   MEDICAL RECORD NO.:  [id number]                   PATIENT TYPE:  INP   LOCATION:  6522                                 FACILITY:  [facility]   PHYSICIAN:  Madaline Savage, M.D.             DATE OF BIRTH:  1945/07/28   DATE OF ADMISSION:  09/26/2002  DATE OF DISCHARGE:  09/28/2002                                 DISCHARGE SUMMARY   DISCHARGE DIAGNOSES:  1. Acute anterior myocardial infarction.  2. Coronary artery disease with emergent cardiac catheterization and     stenting with TAXUS stent to the mid left anterior descending and     proximal left anterior descending.  3. Residual coronary disease with 60-75% left circumflex stenosis.  4. Peripheral vascular disease with a history of left subclavian artery     stent.  5. Ventricular tachycardia secondary to hypokalemia, resolved.  6. Hyperlipidemia.  7. Mild postprocedure anemia.  8. Tobacco use.  9. Anxiety/stress/depression secondary to home situation.   DISCHARGE MEDICATIONS:  1. Metoprolol 50 mg one b.i.d.  2. Asacor 500/20 mg one every evening with a snack and 30 minutes after you     take your aspirin.  3. Enteric-coated aspirin 81 mg daily.  4. Plavix 75 mg one daily, very important.  5. Altace 2.5 mg daily.  6. Nitroglycerin 1/150 mg sublingual p.r.n. chest pain.  7. Stop Norvasc.   DISCHARGE INSTRUCTIONS:  1. Nitroglycerin for chest pain as stated.  2. No strenuous activity.  No work until seen by Dr. Clarene Duke.  3. Low-fat, low-salt diet.  4. Wash catheterization site with soap and water.  Call if any bleeding,     swelling, or drainage.  5. See Dr. Clarene Duke on Wednesday, October 03, 2002, at 1:45 p.m.   HISTORY OF PRESENT ILLNESS:  This 65 year old white married female presented  to the emergency room at [facility] on September 26, 2002, and was seen by Dr.  Lemmie Evens, on-call for Dr. Clarene Duke, for chest pain.  She had had one  brief episode of  chest pain prior to coming in while sitting.  It lasted  five minutes and was relieved with one nitroglycerin tablet.  She was awoken  on September 26, 2002, with another episode of chest pain that was more  intense.  It was described as a pressure or crush in the left anterior  chest.  It radiated to her neck bilaterally and down her left arm.  Associated with dyspnea and diaphoresis.  Several nitroglycerin tablets  improved the chest pain slightly but not completely.  She came to the  emergency room where additional nitroglycerin relieved her pain.   When she first presented to the ER, her EKG demonstrated anterior segment ST  elevation.  When her chest pain resolved, the ST segment elevation resolved.   PAST CARDIAC HISTORY:  History of MI in the past.   PAST MEDICAL  HISTORY:  1. Peripheral vascular disease with a left subclavian stent, as well as     coronary disease.  2. Hypertension.  3. Hyperlipidemia.  4. Tobacco use.   ALLERGIES:  CODEINE.   ADMISSION MEDICATIONS:  Norvasc, aspirin, nitroglycerin, and vitamin E.   SOCIAL HISTORY:  The patient was very tearful at discharge when discussing  her social situation with her husband who recently had a stroke.  He has a  lot of residual issues concerning the stroke.  As well, she has a 65 year  old in the home that they took in that she is caring for and she has a new  boss on her job.  So she has had a lot of stressors and now with cardiac  disease and MI on top of that.  She was quite tearful.   REVIEW OF SYSTEMS:  See H&P.   FAMILY HISTORY:  See H&P.   PHYSICAL EXAMINATION:  VITAL SIGNS:  Blood pressure 130/50, pulse 64,  respirations 20, temperature 97.2 degrees.  GENERAL:  This is an alert and oriented white female.  SKIN:  Warm and dry.  HEART:  S1 and S2 regular rate and rhythm.  No gallop or murmur.  CHEST:  Clear.  ABDOMEN:  Soft and nontender.  Positive bowel sounds.  Right groin with  small hematoma.    LABORATORY DATA:  On admission, hemoglobin 14.6, hematocrit 42.9, WBC 9.4,  platelets 218, neutrophils 41, lymphs 22, monos 9, eos 5, basos 3.  PT 12.3,  INR 0.9, PTT 27.  Sodium 138, potassium 4.5, chloride 111, glucose 167, BUN  9, creatinine 0.5, calcium 8.4, magnesium 1.8.  The potassium dropped to 3.4  and prior to discharge was 3.9.  The CK ranged 74, 232, and 255; CK-MB 2.5,  26.3, and 30.5; Troponin-I 0.10, 2.17, and 2.78.  She had also had a CK-MB  drawn prior to discharge and the CK was 68.  I do not have the MB at this  time.  Total cholesterol 252, triglycerides 472, HDL 30, LDL not calculated.   Chest x-ray on September 27, 2002, [place] and lateral:  Heart size and vascularity  were normal.  The lungs were clear.  There was a stent in the region of the  left subclavian artery.  Also evidence of a coronary artery stent.  No acute  abnormality.  Previous chest x-ray on admission:  No acute cardiopulmonary  process.   EKG:  The initial one on September 26, 2002, at 6:05 showed sinus rhythm, ST  elevation in leads V1-V5 with some reciprocal changes in 2, 3, and aVF.  On  followup at 6:17 after relief of chest pain, T-waves were actually inverted  in V1, 2, 3, 4, and 5 and slightly elevated in her inferior leads.  Postprocedure EKG with sinus rhythm, first degree block, and nonspecific  changes.  I have other EKGs, but unfortunately the time is punched out with  holes, so it is difficult to tell when they were done, all on September 26, 2002.  On September 27, 2002, EKG showed deep T-waves in V4, 5, and 6,  compatible with evolutionary changes of MI.  On September 28, 2002, sinus  bradycardia, heart rate 56, first degree AV block, and again evolutionary  changes.   Cardiac catheterization:  See cardiac catheterization report for complete  details.  She did have LAD stenosis and received two TAXUS stents. Circumflex residual disease of 60-75% in the mid left circumflex.  She had  nonobstructive  disease in the RCA of 30-40%.  The left subclavian stent was  patent.  The patient's EF was 40-45%.   HOSPITAL COURSE:  Ms. Swann was admitted by Dr. Waldon Reining, on-call for Dr.  Clarene Duke, with acute MI with initially ST elevations in her anterior leads.  With resolution of chest pain, these resolved.  The patient was then taken  to the catheterization laboratory on Integrilin, heparin, and nitroglycerin.  She underwent cardiac catheterization emergently with results as stated.  She tolerated the procedure well.   She had some nonsustained ventricular tachycardia with hypokalemia.  These  resolved without problems.   The patient continued to improve.  By September 28, 2002, she was ambulating  without problems.  She would follow up as an outpatient with Dr. Clarene Duke.  She was seen by Dr. Jacinto Halim on September 28, 2002, and discharged home.   PROCEDURES:  1. September 26, 2002, emergent cardiac catheterization by Dr. Madaline Savage.  2. September 26, 2002, rescue percutaneous transluminal coronary angiography     and stenting to the mid and proximal left anterior descending by Dr.     Madaline Savage.      Darcella Gasman. Ingold, N.P.                     Madaline Savage, M.D.    LRI/MEDQ  D:  09/28/2002  T:  09/28/2002  Job:  [id number]   cc:   Thereasa Solo. Little, M.D.  [address]  [place] [address]  Fax: [phone]   Elvina Sidle, M.D.  [address] [place]  [place] [address]  Fax: [phone]

## 2010-07-03 NOTE — Cardiovascular Report (Signed)
NAME:  Denise [name], Denise [name]                          ACCOUNT NO.:  [id number]   MEDICAL RECORD NO.:  [id number]                   PATIENT TYPE:  INP   LOCATION:  1827                                 FACILITY:  [facility]   PHYSICIAN:  Madaline Savage, M.D.             DATE OF BIRTH:  November 11, 1945   DATE OF PROCEDURE:  09/26/2002  DATE OF DISCHARGE:                              CARDIAC CATHETERIZATION   PROCEDURES PERFORMED:  1. Selective coronary angiography by Judkins technique.  2. Retrograde left heart catheterization.  3. Left ventricular angiography.  4. Left subclavian angiography.  5. Percutaneous coronary intervention on mid and proximal left anterior     descending with pre dilatation with a cutting balloon, intracoronary     artery stenting of the proximal and mid left anterior descending, post     dilatation of the proximal left anterior descending Taxus stent.   ENTRY SITE:  Right femoral.   DYE USED:  Omnipaque.   COMPLICATIONS:  None.   MEDICATIONS GIVEN:  IV fentanyl for sedation, IV Integrilin during the case,  IV heparin and IV nitroglycerin were infusing as the patient entered the  catheterization laboratory and were continued while in the catheterization  laboratory.   PATIENT PROFILE:  [name] is a 65 year old white female who is a  cardiology patient of Thereasa Solo. Little, M.D., primary care patient of Elvina Sidle, M.D. who has had a week's worth of chest pain and this morning  had sudden worsening of chest pain and presented to the emergency room at  [facility].  At or around 6 a.m. the emergency department physician saw her and an  EKG at that time showed ST segment elevations in five of the six chest leads  compatible with acute anterior wall injury.  Within 12 minutes of starting  IV nitroglycerin those ST segment changes had resolved completely.  The  first CK-MB was negative.  The first troponin was 0.10 which is weakly  positive.  Subsequent enzymes are not  yet known.  The patient was seen and  evaluated by Quita Skye. Waldon Reining, M.D. who evaluated the patient, ordered  medications and Nanetta Batty, M.D. was notified of the need for cardiac  catheterization.  I received a phone call from Leticia Penna, R.N. of the  cardiac catheterization laboratory indicating that the patient would be  placed on the catheterization laboratory for me at 8:30 and I then went to  the emergency room to see the patient to assess her and found her vital  signs to be normal and she was pain free and her EKG findings were  compatible with complete resolution of ST segment elevation.  We proceeded  to the catheterization laboratory very shortly after my arrival.   RESULTS:  PRESSURES:  The left ventricular pressure was 135/12, end-  diastolic pressure 17.  Central aortic pressure was 135/65, mean of 100.  No  aortic valve gradient  by pullback technique.   ANGIOGRAPHIC RESULTS:  1. The left main coronary artery was medium in length and was approximately     a 3.25-3.5 mm vessel.  There was damping of the guide catheter that     entered the left main and this was averted by using a side hole catheter.     There is probably a 30% area of luminal irregularity in the left main.  2. The LAD gives rise to two diagonal branches, the first of which is very     small, the second of which is medium in size and long and it also gives     rise to one major septal perforator branch and four or five smaller     septal perforator branches.  There were two lesions noted in the LAD.     The first was an eccentric stenosis in the proximal LAD at the     trifurcation of a diagonal #1 LAD and septal perforator.  It was     eccentric and a tight C lesion.  It was very difficult to cross.     Ultimately crossed with a guidewire.  The mid LAD contained a tortuous     75% stenosis and this was after the second diagonal branch.  3. The circumflex was relatively dominant vessel giving rise to  an obtuse     marginal branch and trifurcating distally.  At the trifurcation of a left     atrial branch and a mid circumflex there was a lesion of 60-70% in that     mid left circumflex.  The distal vessel was large and appeared larger     than the LAD and was only minimally irregular.  4. The right coronary artery was nondominant giving rise to a small     posterior descending branch and a posterolateral branch.  There were     lesions of 40% proximal causing damping of the 6-French diagnostic     catheter, a 30% lesion at the junction of the proximal and mid segments,     and then a 50% lesion in the mid RCA.  Basically, this vessel was     diffusely mildly nonobstructive throughout the proximal and mid portions     of the vessel.  The PDA and PLA appeared reasonably normal.  5. The left subclavian in an AP view showed a widely patent left subclavian     stent just beyond the ostium of the vessel.  6. The left ventricle visualized in an RAO projection showed severe apical     hypokinesis and moderate anterolateral wall hypokinesis with only mild     regurgitation.   The percutaneous intervention was performed with a 7-French left Judkins  guide catheter with side holes.  It was also performed with a short Patriot  wire.  The proximal LAD was very difficult to cross and the guidewire tended  to seek out the second diagonal branch.  After about two to three minutes of  probing I was able to place it distally and satisfactorily.   I proceeded with cutting balloon angioplasty of the proximal LAD with a 2.5  x 10 mm cutting balloon and took it up to 6 atmospheres of pressure and I  then proceeded to use this same cutting balloon on the mid LAD which was  about the same size as the vessel and I performed a 6 atmosphere inflation  as well.  ST segment elevations occurred in the mid LAD  in cutting balloon  angioplasty, but not in the proximal cutting balloon angioplasty.  I then stented  the mid LAD with a 2.5 x 16 mm Taxus stent and inflated the  Taxus balloon which was an Express balloon to 16 atmospheres of pressure on  two occasions and did a post dilatation angiogram which showed both a step  up and a step down into this vessel with preservation of TIMI 3 flow.  The  75% lesion was reduced to 0% residual.   The proximal LAD was then stented, of course, after cutting balloon  angioplasty was able to carve out a lumen for the Taxus to track and I  deployed a 3.0 x 16 mm Taxus stent using 14 atmospheres of pressure on two  occasions and then post dilating with a 3.25 x 15 mm Quantum Maverick  balloon to 16 atmospheres once.  The lesion in the proximal LAD was reduced  from 90% eccentric to 0% residual with preservation of flow in the first  diagonal and some jailing or spasm of the second diagonal branch which was  not considered a hemodynamically significant event.  Prognosis is good on  this resolving because I do think it was mostly spasm.   The patient tolerated her procedure fairly well.  No complications occurred  and she was removed from the catheterization laboratory and taken to the  holding area with stable vital signs.   FINAL DIAGNOSES:  1. Acute anterior wall myocardial infarction with suspicion of some     superimposed spasm.  2. Chronic tobacco use.  3. Successful intracoronary artery intervention of the proximal and mid left     anterior descending with both lesions reduced from 75-90% to 0% residual.     See description above.  4. Residual 60-75% stenosis in mid circumflex which will be dealt with on a     staged basis.  5. Mildly impaired left ventricular systolic function, left ventricular     ejection fraction 40-45% with anterolateral wall hypokinesis moderate and     apical severe.  6. Patent left subclavian artery stent.   PLAN:  The patient will go to the CCU and have Integrilin continued for 18  hours.  She has already been given 300 mg  of Plavix which would be continued  at a dose of 75 mg a day for six months.  She will be discharged from the  hospital at the appropriate time and follow up with Gaspar Garbe B. Little, M.D.  to consider staging of her left circumflex.                                                Madaline Savage, M.D.    WHG/MEDQ  D:  09/26/2002  T:  09/26/2002  Job:  [id number]   cc:   Thereasa Solo. Little, M.D.  [address] [place]  [place] [address]  Fax: [phone]   Elvina Sidle, M.D.  [address] [place]  [place] [address]  Fax: [phone]   Cath Lab

## 2010-07-03 NOTE — Cardiovascular Report (Signed)
NAME:  Denise [name], Denise [name]                          ACCOUNT NO.:  [id number]   MEDICAL RECORD NO.:  [id number]                   PATIENT TYPE:  OIB   LOCATION:  2856                                 FACILITY:  [facility]   PHYSICIAN:  Thereasa Solo. Little, M.D.              DATE OF BIRTH:  1945/11/27   DATE OF PROCEDURE:  11/06/2002  DATE OF DISCHARGE:                              CARDIAC CATHETERIZATION   INDICATIONS FOR TEST:  Denise [name] is a 65 year old female who has a long  past history of cardiovascular disease having had intervention to her RCA in  1998 by Dr. Katrinka Blazing in the setting of an acute inferior MI.  She presented  September 26, 2002 with an anterior MI and had two stents (nonoverlapping)  placed in her proximal and mid LAD.  At that time the diagonal which  appeared to come off the terminal portion of the proximal stent had 90%  ostial narrowing.  It was noted at the time of that catheterization that the  circumflex had areas of 70% irregularity and the plan was to bring her back  in four to six weeks for intervention on the circumflex lesion.   PROCEDURE:  The patient is brought into the hospital in a fasting state.  She was prepped and draped in the usual sterile fashion exposing the right  groin.  Following local anesthetic with 1% Xylocaine the Seldinger technique  was employed and the 7-French introducer sheath was placed into the right  femoral artery.  A JL4 guide catheter was placed in the ostium of the left  main and evaluations of her coronary anatomy were undertaken.   Her left main showed 30% terminal narrowing.  Her LAD extended down and  crossed the apex of the heart.  There were two stents, one in the mid and  one in the proximal segment (nonoverlapping stents) that were widely patent.  The first diagonal which was jailed by the proximal stent had 90% ostial  narrowing and was unchanged from September 26, 2002.   The circumflex stayed in the AV groove and gave rise to one  very large OM  vessel.  The proximal portion of this OM vessel had several 40-50% areas of  irregularity.  There was no critical stenosis.  Intracoronary nitroglycerin  was given with no real change in the appearance of these lesions.   After taking multiple views to assess the severity of the circumflex  obstruction is was not felt that these lesions were critical and that they  did not warrant intervention at this time.  She will be discharged to home  later today.  Will continue medical therapy.  Continued to stress importance  of smoking cessation and plan for a nuclear study in about six months.  At  this point she is completely asymptomatic.   TOTAL CONTRAST:  95 mL.   During the procedure her blood pressure was 180/87.  Thereasa Solo. Little, M.D.    ABL/MEDQ  D:  11/06/2002  T:  11/06/2002  Job:  [id number]   cc:   Elvina Sidle, M.D.   Cath Lab

## 2010-07-03 NOTE — H&P (Signed)
NAME:  Denise, [name]                          ACCOUNT NO.:  [id number]   MEDICAL RECORD NO.:  [id number]                   PATIENT TYPE:  EMS   LOCATION:  MAJO                                 FACILITY:  [facility]   PHYSICIAN:  Quita Skye. Waldon Reining, MD             DATE OF BIRTH:  [birth year]   DATE OF ADMISSION:  09/26/2002  DATE OF DISCHARGE:                                HISTORY & PHYSICAL   HISTORY OF PRESENT ILLNESS:  Denise [name] is a 65 year old, white woman who  is admitted to [facility] for unstable angina and a possible acute  myocardial infarction.  The patient has a history of coronary artery  disease.  The details are not known as her hospital chart is not yet  available, but she gives a history of having a myocardial infarction and  subsequent stent placement approximately five years ago.  Her course has  been uncomplicated since then.  The patient experienced one brief episode of  chest pain last night while sitting.  It lasted about five minutes and was  relieved with one nitroglycerin tablet.  She awoke this morning with another  episode of chest pain.  This was more intense.  The chest pain was described  as a pressure or crush in her left anterior chest.  It radiated to her neck  bilaterally and down her left arm.  It was associated with dyspnea,  diaphoresis or nausea.  Several nitroglycerin tablets improved the chest  pain slightly, but did not relieve it completely.  She was brought to the  emergency department where additional nitroglycerin eventually relieved her  chest pain.  When she first presented to the emergency department, her  electrocardiogram demonstrated anterior segment ST elevation.  When her  chest pain resolved, the ST segment elevation resolved.  She is free of  chest pain at this time.   She has no history of congestive heart failure or arrhythmia.  In addition  to the cardiac disease noted above, the patient has a history of  hypertension.  There is no history of hyperlipidemia, diabetes mellitus or  family history of early coronary artery disease.  The patient continues to  smoke.   ALLERGIES:  CODEINE.   CURRENT MEDICATIONS:  Norvasc, aspirin, nitroglycerin and Vitamin E.   SOCIAL HISTORY:  The patient lives with her husband.   REVIEW OF SYMPTOMS:  CARDIOPULMONARY:  The chest pain that she experienced  today appeared not to be related to position, activity, meals or  respirations.  There were no exacerbating or ameliorating factors other than  as described above.  The total duration of chest pain this morning was  approximately one hour.  The patient did experience one seven-beat run of  ventricular tachycardia in the emergency room which resolved spontaneously.  Intravenous lidocaine was then started.  Review of systems reveals no new  problems related to her head, eyes, ears, nose, mouth,  throat, lungs,  gastrointestinal, genitourinary or extremities.  NEUROLOGIC:  There is no  history of neurologic or psychiatric disorder.  CONSTITUTIONAL:  There is no  history of fevers, chills or weight loss.   PHYSICAL EXAMINATION:  VITAL SIGNS:  Blood pressure 137/66, pulse 67 and  regular, temperature 98.  GENERAL:  The patient was an older, white woman in no discomfort.  She is  alert, oriented and responsive.  HEENT:  Head, eyes, nose and mouth normal.  NECK:  Without thyromegaly or adenopathy.  Carotid pulses are palpable  bilaterally and without bruits.  CARDIAC:  Normal S1, S2.  There was no S3, S4, murmur, rub or click.  Cardiac rhythm was regular.  No chest wall tenderness was noted.  LUNGS:  Clear.  ABDOMEN:  Soft, nontender.  There is no mass, hepatosplenomegaly, bruit,  distention, guarding, rebound or rigidity.  Bowel sounds were normal.  BREASTS/PELVIC/RECTAL:  Not performed as they are not pertinent to the  reason for acute care hospitalization.  EXTREMITIES:  Without edema, deviation or  deformity.  Radial and dorsalis  pedis pulses were palpable bilaterally.  NEUROLOGIC:  Unremarkable.   LABORATORY DATA AND X-RAY FINDINGS:  Electrocardiogram demonstrated anterior  ST segment elevation and demonstrated resolution of that.   Potassium was 4.5, BUN 9, creatinine 0.5.  The remaining studies were  pending at the time of this dictation.   IMPRESSION:  1. Unstable angina, rule out myocardial infarction.  Anterior ST segment     elevation with chest pain presently resolved.  2. Coronary artery disease, status post myocardial infarction, status post     stents.  Details and hospital chart not yet available.  3. Hypertension.   PLAN:  1. Emergent cardiac catheterization.  2. Aspirin, heparin and Integrillin in the interim.  3. No beta-blocker as the heart rate is 60.  4. Discontinue smoking.  5. Statins.                                                Quita Skye. Waldon Reining, MD    MSC/MEDQ  D:  09/26/2002  T:  09/26/2002  Job:  [id number]

## 2010-08-20 ENCOUNTER — Other Ambulatory Visit: Payer: Self-pay | Admitting: Gynecology

## 2010-08-20 DIAGNOSIS — Z1231 Encounter for screening mammogram for malignant neoplasm of breast: Secondary | ICD-10-CM

## 2010-09-02 ENCOUNTER — Ambulatory Visit
Admission: RE | Admit: 2010-09-02 | Discharge: 2010-09-02 | Disposition: A | Discharge status: Home / Self Care | Payer: Medicare Other | Source: Ambulatory Visit | Attending: Gynecology | Admitting: Gynecology

## 2010-09-02 DIAGNOSIS — Z1231 Encounter for screening mammogram for malignant neoplasm of breast: Secondary | ICD-10-CM

## 2010-09-03 ENCOUNTER — Ambulatory Visit: Payer: Self-pay

## 2010-09-03 ENCOUNTER — Other Ambulatory Visit: Payer: Self-pay | Admitting: Gynecology

## 2010-09-03 DIAGNOSIS — R928 Other abnormal and inconclusive findings on diagnostic imaging of breast: Secondary | ICD-10-CM

## 2010-09-10 ENCOUNTER — Ambulatory Visit
Admission: RE | Admit: 2010-09-10 | Discharge: 2010-09-10 | Disposition: A | Discharge status: Home / Self Care | Payer: Medicare Other | Source: Ambulatory Visit | Attending: Gynecology | Admitting: Gynecology

## 2010-09-10 DIAGNOSIS — R928 Other abnormal and inconclusive findings on diagnostic imaging of breast: Secondary | ICD-10-CM

## 2010-11-19 LAB — POCT I-STAT 4, (NA,K, GLUC, HGB,HCT)
Glucose, Bld: 103 mg/dL — ABNORMAL HIGH (ref 70–99)
Glucose, Bld: 108 mg/dL — ABNORMAL HIGH (ref 70–99)
Glucose, Bld: 125 mg/dL — ABNORMAL HIGH (ref 70–99)
Glucose, Bld: 127 mg/dL — ABNORMAL HIGH (ref 70–99)
Glucose, Bld: 131 mg/dL — ABNORMAL HIGH (ref 70–99)
Glucose, Bld: 131 mg/dL — ABNORMAL HIGH (ref 70–99)
Glucose, Bld: 135 mg/dL — ABNORMAL HIGH (ref 70–99)
Glucose, Bld: 152 mg/dL — ABNORMAL HIGH (ref 70–99)
Glucose, Bld: 95 mg/dL (ref 70–99)
HCT: 23 % — ABNORMAL LOW (ref 36.0–46.0)
HCT: 24 % — ABNORMAL LOW (ref 36.0–46.0)
HCT: 25 % — ABNORMAL LOW (ref 36.0–46.0)
HCT: 26 % — ABNORMAL LOW (ref 36.0–46.0)
HCT: 26 % — ABNORMAL LOW (ref 36.0–46.0)
HCT: 28 % — ABNORMAL LOW (ref 36.0–46.0)
HCT: 36 % (ref 36.0–46.0)
HCT: 36 % (ref 36.0–46.0)
HCT: 36 % (ref 36.0–46.0)
Hemoglobin: 12.2 g/dL (ref 12.0–15.0)
Hemoglobin: 12.2 g/dL (ref 12.0–15.0)
Hemoglobin: 12.2 g/dL (ref 12.0–15.0)
Hemoglobin: 7.8 g/dL — CL (ref 12.0–15.0)
Hemoglobin: 8.2 g/dL — ABNORMAL LOW (ref 12.0–15.0)
Hemoglobin: 8.5 g/dL — ABNORMAL LOW (ref 12.0–15.0)
Hemoglobin: 8.8 g/dL — ABNORMAL LOW (ref 12.0–15.0)
Hemoglobin: 8.8 g/dL — ABNORMAL LOW (ref 12.0–15.0)
Hemoglobin: 9.5 g/dL — ABNORMAL LOW (ref 12.0–15.0)
Potassium: 3.7 mEq/L (ref 3.5–5.1)
Potassium: 3.8 mEq/L (ref 3.5–5.1)
Potassium: 3.9 mEq/L (ref 3.5–5.1)
Potassium: 3.9 mEq/L (ref 3.5–5.1)
Potassium: 4 mEq/L (ref 3.5–5.1)
Potassium: 4.7 mEq/L (ref 3.5–5.1)
Potassium: 5.4 mEq/L — ABNORMAL HIGH (ref 3.5–5.1)
Potassium: 5.5 mEq/L — ABNORMAL HIGH (ref 3.5–5.1)
Potassium: 5.6 mEq/L — ABNORMAL HIGH (ref 3.5–5.1)
Sodium: 133 mEq/L — ABNORMAL LOW (ref 135–145)
Sodium: 133 mEq/L — ABNORMAL LOW (ref 135–145)
Sodium: 133 mEq/L — ABNORMAL LOW (ref 135–145)
Sodium: 135 mEq/L (ref 135–145)
Sodium: 139 mEq/L (ref 135–145)
Sodium: 139 mEq/L (ref 135–145)
Sodium: 140 mEq/L (ref 135–145)
Sodium: 141 mEq/L (ref 135–145)
Sodium: 144 mEq/L (ref 135–145)

## 2010-11-19 LAB — CBC
HCT: 29.7 % — ABNORMAL LOW (ref 36.0–46.0)
HCT: 30.3 % — ABNORMAL LOW (ref 36.0–46.0)
HCT: 31.1 % — ABNORMAL LOW (ref 36.0–46.0)
HCT: 34.3 % — ABNORMAL LOW (ref 36.0–46.0)
HCT: 35.2 % — ABNORMAL LOW (ref 36.0–46.0)
HCT: 43.8 % (ref 36.0–46.0)
Hemoglobin: 10.2 g/dL — ABNORMAL LOW (ref 12.0–15.0)
Hemoglobin: 10.3 g/dL — ABNORMAL LOW (ref 12.0–15.0)
Hemoglobin: 11.4 g/dL — ABNORMAL LOW (ref 12.0–15.0)
Hemoglobin: 11.6 g/dL — ABNORMAL LOW (ref 12.0–15.0)
Hemoglobin: 14.6 g/dL (ref 12.0–15.0)
Hemoglobin: 9.3 g/dL — ABNORMAL LOW (ref 12.0–15.0)
Hemoglobin: 9.7 g/dL — ABNORMAL LOW (ref 12.0–15.0)
MCHC: 32.7 g/dL (ref 30.0–36.0)
MCHC: 32.8 g/dL (ref 30.0–36.0)
MCHC: 33 g/dL (ref 30.0–36.0)
MCHC: 33.3 g/dL (ref 30.0–36.0)
MCHC: 33.4 g/dL (ref 30.0–36.0)
MCHC: 34 g/dL (ref 30.0–36.0)
MCV: 94.8 fL (ref 78.0–100.0)
MCV: 94.9 fL (ref 78.0–100.0)
MCV: 95.5 fL (ref 78.0–100.0)
MCV: 96.2 fL (ref 78.0–100.0)
MCV: 96.9 fL (ref 78.0–100.0)
MCV: 96.9 fL (ref 78.0–100.0)
Platelets: 119 10*3/uL — ABNORMAL LOW (ref 150–400)
Platelets: 129 10*3/uL — ABNORMAL LOW (ref 150–400)
Platelets: 132 10*3/uL — ABNORMAL LOW (ref 150–400)
Platelets: 151 10*3/uL (ref 150–400)
Platelets: 152 10*3/uL (ref 150–400)
Platelets: 271 10*3/uL (ref 150–400)
RBC: 2.92 MIL/uL — ABNORMAL LOW (ref 3.87–5.11)
RBC: 3.06 MIL/uL — ABNORMAL LOW (ref 3.87–5.11)
RBC: 3.19 MIL/uL — ABNORMAL LOW (ref 3.87–5.11)
RBC: 3.21 MIL/uL — ABNORMAL LOW (ref 3.87–5.11)
RBC: 3.6 MIL/uL — ABNORMAL LOW (ref 3.87–5.11)
RBC: 3.66 MIL/uL — ABNORMAL LOW (ref 3.87–5.11)
RBC: 4.61 MIL/uL (ref 3.87–5.11)
RDW: 12.6 % (ref 11.5–15.5)
RDW: 12.7 % (ref 11.5–15.5)
RDW: 12.7 % (ref 11.5–15.5)
RDW: 12.8 % (ref 11.5–15.5)
RDW: 13 % (ref 11.5–15.5)
RDW: 13 % (ref 11.5–15.5)
WBC: 11 10*3/uL — ABNORMAL HIGH (ref 4.0–10.5)
WBC: 13.2 10*3/uL — ABNORMAL HIGH (ref 4.0–10.5)
WBC: 14.3 10*3/uL — ABNORMAL HIGH (ref 4.0–10.5)
WBC: 15.2 10*3/uL — ABNORMAL HIGH (ref 4.0–10.5)
WBC: 16.1 10*3/uL — ABNORMAL HIGH (ref 4.0–10.5)
WBC: 7.5 10*3/uL (ref 4.0–10.5)

## 2010-11-19 LAB — BASIC METABOLIC PANEL
BUN: 6 mg/dL (ref 6–23)
BUN: 9 mg/dL (ref 6–23)
CO2: 25 mEq/L (ref 19–32)
CO2: 28 mEq/L (ref 19–32)
Calcium: 7.9 mg/dL — ABNORMAL LOW (ref 8.4–10.5)
Calcium: 8.4 mg/dL (ref 8.4–10.5)
Calcium: 8.5 mg/dL (ref 8.4–10.5)
Chloride: 103 mEq/L (ref 96–112)
Chloride: 111 mEq/L (ref 96–112)
Creatinine, Ser: 0.56 mg/dL (ref 0.4–1.2)
Creatinine, Ser: 0.58 mg/dL (ref 0.4–1.2)
GFR calc Af Amer: >60 mL/min (ref >60)
GFR calc Af Amer: >60 mL/min (ref >60)
GFR calc Af Amer: >60 mL/min (ref >60)
GFR calc non Af Amer: >60 mL/min (ref >60)
GFR calc non Af Amer: >60 mL/min (ref >60)
GFR calc non Af Amer: >60 mL/min (ref >60)
Glucose, Bld: 118 mg/dL — ABNORMAL HIGH (ref 70–99)
Glucose, Bld: 152 mg/dL — ABNORMAL HIGH (ref 70–99)
Potassium: 4 mEq/L (ref 3.5–5.1)
Potassium: 4.1 mEq/L (ref 3.5–5.1)
Sodium: 135 mEq/L (ref 135–145)
Sodium: 140 mEq/L (ref 135–145)
Sodium: 140 mEq/L (ref 135–145)

## 2010-11-19 LAB — URINALYSIS, ROUTINE W REFLEX MICROSCOPIC
Bilirubin Urine: NEGATIVE
Glucose, UA: NEGATIVE mg/dL
Hgb urine dipstick: NEGATIVE
Ketones, ur: NEGATIVE mg/dL
Nitrite: NEGATIVE
Protein, ur: NEGATIVE mg/dL
Specific Gravity, Urine: 1.007 (ref 1.005–1.030)
Urobilinogen, UA: 0.2 mg/dL (ref 0.0–1.0)
pH: 6 (ref 5.0–8.0)

## 2010-11-19 LAB — POCT I-STAT 3, ART BLOOD GAS (G3+)
Acid-Base Excess: 1 mmol/L (ref 0.0–2.0)
Acid-base deficit: 1 mmol/L (ref 0.0–2.0)
Acid-base deficit: 1 mmol/L (ref 0.0–2.0)
Acid-base deficit: 1 mmol/L (ref 0.0–2.0)
Acid-base deficit: 2 mmol/L (ref 0.0–2.0)
Bicarbonate: 24.2 mEq/L — ABNORMAL HIGH (ref 20.0–24.0)
Bicarbonate: 24.6 mEq/L — ABNORMAL HIGH (ref 20.0–24.0)
Bicarbonate: 25.2 mEq/L — ABNORMAL HIGH (ref 20.0–24.0)
Bicarbonate: 25.3 mEq/L — ABNORMAL HIGH (ref 20.0–24.0)
Bicarbonate: 25.3 mEq/L — ABNORMAL HIGH (ref 20.0–24.0)
Bicarbonate: 25.4 mEq/L — ABNORMAL HIGH (ref 20.0–24.0)
Bicarbonate: 25.6 mEq/L — ABNORMAL HIGH (ref 20.0–24.0)
O2 Saturation: 100 %
O2 Saturation: 100 %
O2 Saturation: 100 %
O2 Saturation: 100 %
O2 Saturation: 100 %
O2 Saturation: 99 %
O2 Saturation: 99 %
Patient temperature: 35.9
Patient temperature: 37
Patient temperature: 37
TCO2: 26 mmol/L (ref 0–100)
TCO2: 26 mmol/L (ref 0–100)
TCO2: 26 mmol/L (ref 0–100)
TCO2: 27 mmol/L (ref 0–100)
TCO2: 27 mmol/L (ref 0–100)
TCO2: 27 mmol/L (ref 0–100)
TCO2: 27 mmol/L (ref 0–100)
pCO2 arterial: 39.9 mmHg (ref 35.0–45.0)
pCO2 arterial: 43.8 mmHg (ref 35.0–45.0)
pCO2 arterial: 44.7 mmHg (ref 35.0–45.0)
pCO2 arterial: 46.1 mmHg — ABNORMAL HIGH (ref 35.0–45.0)
pCO2 arterial: 47.2 mmHg — ABNORMAL HIGH (ref 35.0–45.0)
pCO2 arterial: 49.5 mmHg — ABNORMAL HIGH (ref 35.0–45.0)
pCO2 arterial: 50.3 mmHg — ABNORMAL HIGH (ref 35.0–45.0)
pH, Arterial: 7.296 — ABNORMAL LOW (ref 7.350–7.400)
pH, Arterial: 7.309 — ABNORMAL LOW (ref 7.350–7.400)
pH, Arterial: 7.343 — ABNORMAL LOW (ref 7.350–7.400)
pH, Arterial: 7.343 — ABNORMAL LOW (ref 7.350–7.400)
pH, Arterial: 7.349 — ABNORMAL LOW (ref 7.350–7.400)
pH, Arterial: 7.37 (ref 7.350–7.400)
pH, Arterial: 7.408 — ABNORMAL HIGH (ref 7.350–7.400)
pO2, Arterial: 139 mmHg — ABNORMAL HIGH (ref 80.0–100.0)
pO2, Arterial: 171 mmHg — ABNORMAL HIGH (ref 80.0–100.0)
pO2, Arterial: 237 mmHg — ABNORMAL HIGH (ref 80.0–100.0)
pO2, Arterial: 293 mmHg — ABNORMAL HIGH (ref 80.0–100.0)
pO2, Arterial: 305 mmHg — ABNORMAL HIGH (ref 80.0–100.0)
pO2, Arterial: 313 mmHg — ABNORMAL HIGH (ref 80.0–100.0)
pO2, Arterial: 353 mmHg — ABNORMAL HIGH (ref 80.0–100.0)

## 2010-11-19 LAB — APTT
aPTT: 27 seconds (ref 24–37)
aPTT: 32 seconds (ref 24–37)

## 2010-11-19 LAB — COMPREHENSIVE METABOLIC PANEL
ALT: 19 U/L (ref 0–35)
AST: 25 U/L (ref 0–37)
Albumin: 3.6 g/dL (ref 3.5–5.2)
Alkaline Phosphatase: 103 U/L (ref 39–117)
BUN: 14 mg/dL (ref 6–23)
CO2: 21 mEq/L (ref 19–32)
Calcium: 9.3 mg/dL (ref 8.4–10.5)
Chloride: 106 mEq/L (ref 96–112)
Creatinine, Ser: 0.55 mg/dL (ref 0.4–1.2)
GFR calc Af Amer: >60 mL/min (ref >60)
GFR calc non Af Amer: >60 mL/min (ref >60)
Glucose, Bld: 138 mg/dL — ABNORMAL HIGH (ref 70–99)
Potassium: 4 mEq/L (ref 3.5–5.1)
Sodium: 136 mEq/L (ref 135–145)
Total Bilirubin: 0.7 mg/dL (ref 0.3–1.2)
Total Protein: 6.7 g/dL (ref 6.0–8.3)

## 2010-11-19 LAB — BLOOD GAS, ARTERIAL
Acid-base deficit: 2.1 mmol/L — ABNORMAL HIGH (ref 0.0–2.0)
Bicarbonate: 22.2 mEq/L (ref 20.0–24.0)
Drawn by: 181601
FIO2: 0.21 %
O2 Saturation: 97.1 %
Patient temperature: 98.6
TCO2: 23.4 mmol/L (ref 0–100)
pCO2 arterial: 38.2 mmHg (ref 35.0–45.0)
pH, Arterial: 7.383 (ref 7.350–7.400)
pO2, Arterial: 87.3 mmHg (ref 80.0–100.0)

## 2010-11-19 LAB — MAGNESIUM
Magnesium: 2.1 mg/dL (ref 1.5–2.5)
Magnesium: 2.3 mg/dL (ref 1.5–2.5)
Magnesium: 3 mg/dL — ABNORMAL HIGH (ref 1.5–2.5)

## 2010-11-19 LAB — GLUCOSE, CAPILLARY
Glucose-Capillary: 101 mg/dL — ABNORMAL HIGH (ref 70–99)
Glucose-Capillary: 110 mg/dL — ABNORMAL HIGH (ref 70–99)
Glucose-Capillary: 111 mg/dL — ABNORMAL HIGH (ref 70–99)
Glucose-Capillary: 112 mg/dL — ABNORMAL HIGH (ref 70–99)
Glucose-Capillary: 113 mg/dL — ABNORMAL HIGH (ref 70–99)
Glucose-Capillary: 113 mg/dL — ABNORMAL HIGH (ref 70–99)
Glucose-Capillary: 115 mg/dL — ABNORMAL HIGH (ref 70–99)
Glucose-Capillary: 118 mg/dL — ABNORMAL HIGH (ref 70–99)
Glucose-Capillary: 120 mg/dL — ABNORMAL HIGH (ref 70–99)
Glucose-Capillary: 123 mg/dL — ABNORMAL HIGH (ref 70–99)
Glucose-Capillary: 126 mg/dL — ABNORMAL HIGH (ref 70–99)
Glucose-Capillary: 128 mg/dL — ABNORMAL HIGH (ref 70–99)
Glucose-Capillary: 128 mg/dL — ABNORMAL HIGH (ref 70–99)
Glucose-Capillary: 130 mg/dL — ABNORMAL HIGH (ref 70–99)
Glucose-Capillary: 134 mg/dL — ABNORMAL HIGH (ref 70–99)
Glucose-Capillary: 135 mg/dL — ABNORMAL HIGH (ref 70–99)
Glucose-Capillary: 138 mg/dL — ABNORMAL HIGH (ref 70–99)
Glucose-Capillary: 139 mg/dL — ABNORMAL HIGH (ref 70–99)
Glucose-Capillary: 146 mg/dL — ABNORMAL HIGH (ref 70–99)
Glucose-Capillary: 149 mg/dL — ABNORMAL HIGH (ref 70–99)
Glucose-Capillary: 174 mg/dL — ABNORMAL HIGH (ref 70–99)
Glucose-Capillary: 192 mg/dL — ABNORMAL HIGH (ref 70–99)
Glucose-Capillary: 99 mg/dL (ref 70–99)

## 2010-11-19 LAB — POCT I-STAT, CHEM 8
BUN: 5 mg/dL — ABNORMAL LOW (ref 6–23)
BUN: 9 mg/dL (ref 6–23)
Calcium, Ion: 1.14 mmol/L (ref 1.12–1.32)
Calcium, Ion: 1.26 mmol/L (ref 1.12–1.32)
Chloride: 102 mEq/L (ref 96–112)
Chloride: 109 mEq/L (ref 96–112)
Creatinine, Ser: 0.6 mg/dL (ref 0.4–1.2)
Creatinine, Ser: 0.7 mg/dL (ref 0.4–1.2)
Glucose, Bld: 128 mg/dL — ABNORMAL HIGH (ref 70–99)
Glucose, Bld: 148 mg/dL — ABNORMAL HIGH (ref 70–99)
HCT: 32 % — ABNORMAL LOW (ref 36.0–46.0)
HCT: 35 % — ABNORMAL LOW (ref 36.0–46.0)
Hemoglobin: 10.9 g/dL — ABNORMAL LOW (ref 12.0–15.0)
Hemoglobin: 11.9 g/dL — ABNORMAL LOW (ref 12.0–15.0)
Potassium: 4 mEq/L (ref 3.5–5.1)
Potassium: 4.3 mEq/L (ref 3.5–5.1)
Sodium: 141 mEq/L (ref 135–145)
Sodium: 144 mEq/L (ref 135–145)
TCO2: 24 mmol/L (ref 0–100)
TCO2: 26 mmol/L (ref 0–100)

## 2010-11-19 LAB — CREATININE, SERUM
Creatinine, Ser: 0.53 mg/dL (ref 0.4–1.2)
Creatinine, Ser: 0.63 mg/dL (ref 0.4–1.2)
GFR calc Af Amer: >60 mL/min (ref >60)
GFR calc Af Amer: >60 mL/min (ref >60)
GFR calc non Af Amer: >60 mL/min (ref >60)
GFR calc non Af Amer: >60 mL/min (ref >60)

## 2010-11-19 LAB — TYPE AND SCREEN
ABO/RH(D): A POS
Antibody Screen: NEGATIVE

## 2010-11-19 LAB — ABO/RH: ABO/RH(D): A POS

## 2010-11-19 LAB — PROTIME-INR
INR: 0.9 (ref 0.00–1.49)
INR: 1.3 (ref 0.00–1.49)
Prothrombin Time: 12 seconds (ref 11.6–15.2)
Prothrombin Time: 16.5 seconds — ABNORMAL HIGH (ref 11.6–15.2)

## 2010-11-19 LAB — POCT I-STAT 3, VENOUS BLOOD GAS (G3P V)
Acid-base deficit: 4 mmol/L — ABNORMAL HIGH (ref 0.0–2.0)
Bicarbonate: 23.6 mEq/L (ref 20.0–24.0)
O2 Saturation: 85 %
TCO2: 25 mmol/L (ref 0–100)
pCO2, Ven: 56.6 mmHg — ABNORMAL HIGH (ref 45.0–50.0)
pH, Ven: 7.227 — ABNORMAL LOW (ref 7.250–7.300)
pO2, Ven: 60 mmHg — ABNORMAL HIGH (ref 30.0–45.0)

## 2010-11-19 LAB — HEMOGLOBIN A1C
Hgb A1c MFr Bld: 6.5 % — ABNORMAL HIGH (ref 4.6–6.1)
Mean Plasma Glucose: 140 mg/dL

## 2010-11-19 LAB — HEMOGLOBIN AND HEMATOCRIT, BLOOD
HCT: 25.8 % — ABNORMAL LOW (ref 36.0–46.0)
Hemoglobin: 8.6 g/dL — ABNORMAL LOW (ref 12.0–15.0)

## 2010-11-19 LAB — PLATELET COUNT: Platelets: 143 10*3/uL — ABNORMAL LOW (ref 150–400)

## 2011-08-16 ENCOUNTER — Ambulatory Visit (INDEPENDENT_AMBULATORY_CARE_PROVIDER_SITE_OTHER): Payer: Medicare Other | Admitting: General Surgery

## 2011-09-02 ENCOUNTER — Encounter (INDEPENDENT_AMBULATORY_CARE_PROVIDER_SITE_OTHER): Payer: Self-pay | Admitting: General Surgery

## 2011-09-02 ENCOUNTER — Ambulatory Visit (INDEPENDENT_AMBULATORY_CARE_PROVIDER_SITE_OTHER): Payer: Medicare Other | Admitting: General Surgery

## 2011-09-02 VITALS — BP 140/74 | HR 68 | Temp 98.2°F | Ht 64.0 in | Wt 152.8 lb

## 2011-09-02 DIAGNOSIS — K8020 Calculus of gallbladder without cholecystitis without obstruction: Secondary | ICD-10-CM

## 2011-09-02 NOTE — Progress Notes (Signed)
Subjective:     Patient ID: Denise [name], female   DOB: 1945-05-14, 66 y.o.   MRN: [id number]  HPI The patient is a 66 year old white female who we saw a year and a half ago with symptomatic gallstones. We tried to schedule her for surgery but some family matters kept her from scheduling. During the last year and a half she does get some occasional pain in her right upper quadrant. She's had a couple episodes that she would describe as severe. She denies any nausea or vomiting. She does have diarrhea every day. She is followed by Dr. Clarene Duke and cardiology and has not had to have a pacemaker placed since we saw her last  Review of Systems  Constitutional: Negative.   HENT: Negative.   Eyes: Negative.   Respiratory: Negative.   Cardiovascular: Negative.   Gastrointestinal: Positive for abdominal pain and diarrhea.  Genitourinary: Negative.   Musculoskeletal: Negative.   Skin: Negative.   Neurological: Negative.   Hematological: Negative.   Psychiatric/Behavioral: Negative.        Objective:   Physical Exam  Constitutional: She is oriented to person, place, and time. She appears well-nourished.  HENT:  Head: Normocephalic and atraumatic.  Eyes: Conjunctivae and EOM are normal. Pupils are equal, round, and reactive to light.  Neck: Normal range of motion. Neck supple.  Cardiovascular: Normal rate and regular rhythm.   Murmur heard. Pulmonary/Chest: Effort normal and breath sounds normal.  Abdominal: Soft. Bowel sounds are normal.       She has mild right upper quadrant pain. no palpable mass  Musculoskeletal: Normal range of motion.  Neurological: She is alert and oriented to person, place, and time.  Skin: Skin is warm and dry.  Psychiatric: She has a normal mood and affect. Her behavior is normal.       Assessment:     The patient is a 66 year old white female with what sounds like symptomatic gallstones as well as some cardiac history. I do think removing her gallbladder  will help her right upper quadrant pain. It is not clear to me that removing her gallbladder will help her diarrhea. I have discussed with her in detail the risks and benefits of the operation to remove the gallbladder as well as some of the technical aspects and she understands and wishes to proceed. We will obtain cardiac clearance again from Dr. little since it has been a year and a half.    Plan:     Plan for laparoscopic cholecystectomy with intraoperative cholangiogram once we have cardiac clearance

## 2011-09-09 DIAGNOSIS — I2510 Atherosclerotic heart disease of native coronary artery without angina pectoris: Secondary | ICD-10-CM

## 2011-09-09 HISTORY — DX: Atherosclerotic heart disease of native coronary artery without angina pectoris: I25.10

## 2011-09-20 ENCOUNTER — Telehealth (INDEPENDENT_AMBULATORY_CARE_PROVIDER_SITE_OTHER): Payer: Self-pay | Admitting: General Surgery

## 2011-09-20 ENCOUNTER — Other Ambulatory Visit (INDEPENDENT_AMBULATORY_CARE_PROVIDER_SITE_OTHER): Payer: Self-pay | Admitting: General Surgery

## 2011-09-20 NOTE — Telephone Encounter (Signed)
Called pt to let her know that her cardiac clearance has come back and that I have sent her surgery orders around to the surgery schedulers and they should be calling her in the next couple of days to get the date on the books.  Advised the pt to stop any aspirin and herbal supplements 5 days before the surgery date.  She was fine with this.

## 2011-09-30 ENCOUNTER — Encounter (HOSPITAL_COMMUNITY): Payer: Self-pay | Admitting: Pharmacy Technician

## 2011-10-08 ENCOUNTER — Encounter (HOSPITAL_COMMUNITY)
Admission: RE | Admit: 2011-10-08 | Discharge: 2011-10-08 | Disposition: A | Discharge status: Home / Self Care | Payer: Medicare Other | Source: Ambulatory Visit | Attending: General Surgery | Admitting: General Surgery

## 2011-10-08 ENCOUNTER — Encounter (HOSPITAL_COMMUNITY): Payer: Self-pay

## 2011-10-08 ENCOUNTER — Inpatient Hospital Stay (HOSPITAL_COMMUNITY): Admission: RE | Admit: 2011-10-08 | Payer: Medicare Other | Source: Ambulatory Visit

## 2011-10-08 ENCOUNTER — Telehealth (INDEPENDENT_AMBULATORY_CARE_PROVIDER_SITE_OTHER): Payer: Self-pay

## 2011-10-08 HISTORY — DX: Atherosclerotic heart disease of native coronary artery without angina pectoris: I25.10

## 2011-10-08 HISTORY — DX: Shortness of breath: R06.02

## 2011-10-08 HISTORY — DX: Cerebral infarction, unspecified: I63.9

## 2011-10-08 LAB — BASIC METABOLIC PANEL
BUN: 7 mg/dL (ref 6–23)
Calcium: 9.9 mg/dL (ref 8.4–10.5)
Creatinine, Ser: 0.65 mg/dL (ref 0.50–1.10)
GFR calc non Af Amer: >90 mL/min (ref >90)
Glucose, Bld: 135 mg/dL — ABNORMAL HIGH (ref 70–99)

## 2011-10-08 LAB — CBC
MCH: 31.2 pg (ref 26.0–34.0)
MCHC: 32.6 g/dL (ref 30.0–36.0)
Platelets: 244 10*3/uL (ref 150–400)

## 2011-10-08 NOTE — Pre-Procedure Instructions (Signed)
20 Denise [name]  10/08/2011   Your procedure is scheduled on:  Thursday August 29  Report to [facility] at 5:30 AM.  Call this number if you have problems the morning of surgery: [phone]   Remember:   Do not eat or drink:After Midnight.    Take these medicines the morning of surgery with A SIP OF WATER: Norvasc (amlodipine), Zyrtec (cetirizine), Celexa (citalopram), Prilosec (omeprazole)   Do not wear jewelry, make-up or nail polish.  Do not wear lotions, powders, or perfumes. You may wear deodorant.  Do not shave 48 hours prior to surgery. Men may shave face and neck.  Do not bring valuables to the hospital.  Contacts, dentures or bridgework may not be worn into surgery.  Leave suitcase in the car. After surgery it may be brought to your room.  For patients admitted to the hospital, checkout time is 11:00 AM the day of discharge.   Patients discharged the day of surgery will not be allowed to drive home.  Name and phone number of your driver: NA  Special Instructions: CHG Shower Use Special Wash: 1/2 bottle night before surgery and 1/2 bottle morning of surgery.   Please read over the following fact sheets that you were given: Pain Booklet, Coughing and Deep Breathing and Surgical Site Infection Prevention

## 2011-10-08 NOTE — Progress Notes (Signed)
Requested cardiac clearance, cardiac testing from Dr. Clarene Duke at [facility].

## 2011-10-08 NOTE — Telephone Encounter (Signed)
Beth at [facility] called to advised Dr Carolynne Edouard of abn pre op cxr. Advised msg will be sent to Dr Carolynne Edouard and Penni Bombard to review.

## 2011-10-12 ENCOUNTER — Telehealth (INDEPENDENT_AMBULATORY_CARE_PROVIDER_SITE_OTHER): Payer: Self-pay | Admitting: General Surgery

## 2011-10-12 NOTE — Telephone Encounter (Signed)
LMOM letting pt know that her first PO appt w/ Dr. Carolynne Edouard will be on 9/16 at 2:20.  I also sent a reminder card in the mail.

## 2011-10-12 NOTE — Consult Note (Signed)
Anesthesia chart review: Patient is a 67 year old female scheduled for laparoscopic cholecystectomy on 10/14/2011 by Dr. Carolynne Edouard.  History includes smoking, hyperlipidemia, hypertension, "light" stroke, GERD, shortness of breath, coronary artery disease s/p RCA stent ''98, LAD stents X 2 '04 and CABG (LIMA to LAD, SVG to DIAG, SVG to CX, SVG to RCA) '09, post-CABG PAF, left Ruby artery stent.  Her Cardiologist is Dr. Clarene Duke [facility]).  She was last seen on 08/17/11.  She had a nuclear stress test done on 09/10/11 that showed moderate to severe perfusion defect in the basal inferior, midinferior, basal, inferior, lateral, midinferior lateral and apical lateral legions consistent with infarct/scar. Study was non-gated secondary to ectopy. Compared to the previous study, the defect distribution was felt to be identical, but the defect was now severe and fixed. Overall, it was felt to be a low risk.  Dr. Clarene Duke cleared her with low CV risk for this procedure.  EKG ([facility]) on 08/02/11 showed SB @ 46, diffuse non-specific ST/T wave abnormalities.  Intra-operative (CABG) TEE on 02/07/08 showed dynamic LV function with LV inferior wall moderate hypokinseis.  Pre-CABG cath on 01/30/08 showed severe 3V CAD, EF 40-45%.  CXR on 10/08/11 showed question right upper lobe nodule 8 mm diameter; recommend CT chest with contrast to exclude pulmonary nodule. Question minimal emphysematous changes. Dr. Carolynne Edouard has reviewed this result and plans to order a CT scan at her post-operative visit.    Labs noted.  Dr. Carolynne Edouard will follow-up on chest CT.  If no acute cardiopulmonary symptoms, then anticipate she can proceed.  Shonna Chock, PA-C

## 2011-10-13 MED ORDER — CEFAZOLIN SODIUM-DEXTROSE 2-3 GM-% IV SOLR
2.0000 g | INTRAVENOUS | Status: DC
  Filled 2011-10-13: qty 50

## 2011-10-14 ENCOUNTER — Ambulatory Visit (HOSPITAL_COMMUNITY)
Admission: RE | Admit: 2011-10-14 | Discharge: 2011-10-14 | Disposition: A | Discharge status: Home / Self Care | Payer: Medicare Other | Source: Ambulatory Visit | Attending: General Surgery | Admitting: General Surgery

## 2011-10-14 ENCOUNTER — Ambulatory Visit (HOSPITAL_COMMUNITY): Payer: Medicare Other

## 2011-10-14 ENCOUNTER — Encounter (HOSPITAL_COMMUNITY): Payer: Self-pay | Admitting: Vascular Surgery

## 2011-10-14 ENCOUNTER — Ambulatory Visit (HOSPITAL_COMMUNITY): Payer: Medicare Other | Admitting: Vascular Surgery

## 2011-10-14 ENCOUNTER — Encounter (HOSPITAL_COMMUNITY): Payer: Self-pay | Admitting: *Deleted

## 2011-10-14 ENCOUNTER — Encounter (HOSPITAL_COMMUNITY)
Admission: RE | Disposition: A | Discharge status: Home / Self Care | Payer: Self-pay | Source: Ambulatory Visit | Attending: General Surgery

## 2011-10-14 DIAGNOSIS — F17200 Nicotine dependence, unspecified, uncomplicated: Secondary | ICD-10-CM | POA: Insufficient documentation

## 2011-10-14 DIAGNOSIS — Z9861 Coronary angioplasty status: Secondary | ICD-10-CM | POA: Insufficient documentation

## 2011-10-14 DIAGNOSIS — Z951 Presence of aortocoronary bypass graft: Secondary | ICD-10-CM | POA: Insufficient documentation

## 2011-10-14 DIAGNOSIS — I10 Essential (primary) hypertension: Secondary | ICD-10-CM | POA: Insufficient documentation

## 2011-10-14 DIAGNOSIS — Z01812 Encounter for preprocedural laboratory examination: Secondary | ICD-10-CM | POA: Insufficient documentation

## 2011-10-14 DIAGNOSIS — J449 Chronic obstructive pulmonary disease, unspecified: Secondary | ICD-10-CM | POA: Insufficient documentation

## 2011-10-14 DIAGNOSIS — I252 Old myocardial infarction: Secondary | ICD-10-CM | POA: Insufficient documentation

## 2011-10-14 DIAGNOSIS — K8010 Calculus of gallbladder with chronic cholecystitis without obstruction: Secondary | ICD-10-CM

## 2011-10-14 DIAGNOSIS — K219 Gastro-esophageal reflux disease without esophagitis: Secondary | ICD-10-CM | POA: Insufficient documentation

## 2011-10-14 DIAGNOSIS — Z01818 Encounter for other preprocedural examination: Secondary | ICD-10-CM | POA: Insufficient documentation

## 2011-10-14 DIAGNOSIS — K8020 Calculus of gallbladder without cholecystitis without obstruction: Secondary | ICD-10-CM

## 2011-10-14 DIAGNOSIS — J4489 Other specified chronic obstructive pulmonary disease: Secondary | ICD-10-CM | POA: Insufficient documentation

## 2011-10-14 DIAGNOSIS — I2510 Atherosclerotic heart disease of native coronary artery without angina pectoris: Secondary | ICD-10-CM | POA: Insufficient documentation

## 2011-10-14 HISTORY — PX: CHOLECYSTECTOMY: SHX55

## 2011-10-14 LAB — DIFFERENTIAL
Basophils Absolute: 0.1 10*3/uL (ref 0.0–0.1)
Basophils Relative: 1 % (ref 0–1)
Monocytes Absolute: 0.9 10*3/uL (ref 0.1–1.0)
Neutro Abs: 3.6 10*3/uL (ref 1.7–7.7)
Neutrophils Relative %: 42 % — ABNORMAL LOW (ref 43–77)

## 2011-10-14 LAB — HEPATIC FUNCTION PANEL
AST: 18 U/L (ref 0–37)
Bilirubin, Direct: <0.1 mg/dL (ref 0.0–0.3)

## 2011-10-14 SURGERY — LAPAROSCOPIC CHOLECYSTECTOMY WITH INTRAOPERATIVE CHOLANGIOGRAM
Anesthesia: General | Site: Abdomen | Wound class: Clean Contaminated

## 2011-10-14 MED ORDER — LACTATED RINGERS IV SOLN
INTRAVENOUS | Status: DC | PRN
  Administered 2011-10-14 (×2): via INTRAVENOUS

## 2011-10-14 MED ORDER — SODIUM CHLORIDE 0.9 % IJ SOLN: 3.0000 mL | INTRAMUSCULAR | Status: DC | PRN

## 2011-10-14 MED ORDER — CHLORHEXIDINE GLUCONATE 4 % EX LIQD: 1.0000 "application " | Freq: Once | CUTANEOUS | Status: DC

## 2011-10-14 MED ORDER — MEPERIDINE HCL 25 MG/ML IJ SOLN: 6.2500 mg | INTRAMUSCULAR | Status: DC | PRN

## 2011-10-14 MED ORDER — SODIUM CHLORIDE 0.9 % IV SOLN
INTRAVENOUS | Status: DC | PRN
  Administered 2011-10-14: 08:00:00

## 2011-10-14 MED ORDER — SODIUM CHLORIDE 0.9 % IV SOLN: 250.0000 mL | INTRAVENOUS | Status: DC | PRN

## 2011-10-14 MED ORDER — BUPIVACAINE-EPINEPHRINE PF 0.25-1:200000 % IJ SOLN
INTRAMUSCULAR | Status: AC
  Filled 2011-10-14: qty 30

## 2011-10-14 MED ORDER — EPHEDRINE SULFATE 50 MG/ML IJ SOLN
INTRAMUSCULAR | Status: DC | PRN
  Administered 2011-10-14 (×2): 5 mg via INTRAVENOUS
  Administered 2011-10-14: 2.5 mg via INTRAVENOUS

## 2011-10-14 MED ORDER — ONDANSETRON HCL 4 MG/2ML IJ SOLN
INTRAMUSCULAR | Status: DC | PRN
  Administered 2011-10-14: 4 mg via INTRAVENOUS

## 2011-10-14 MED ORDER — BUPIVACAINE-EPINEPHRINE 0.25% -1:200000 IJ SOLN
INTRAMUSCULAR | Status: DC | PRN
  Administered 2011-10-14: 28 mL

## 2011-10-14 MED ORDER — NEOSTIGMINE METHYLSULFATE 1 MG/ML IJ SOLN
INTRAMUSCULAR | Status: DC | PRN
  Administered 2011-10-14: 3.5 mg via INTRAVENOUS

## 2011-10-14 MED ORDER — KCL IN DEXTROSE-NACL 20-5-0.9 MEQ/L-%-% IV SOLN: INTRAVENOUS | Status: DC

## 2011-10-14 MED ORDER — ACETAMINOPHEN 325 MG PO TABS: 650.0000 mg | ORAL_TABLET | ORAL | Status: DC | PRN

## 2011-10-14 MED ORDER — LIDOCAINE HCL (CARDIAC) 20 MG/ML IV SOLN
INTRAVENOUS | Status: DC | PRN
  Administered 2011-10-14: 40 mg via INTRAVENOUS

## 2011-10-14 MED ORDER — OXYCODONE HCL 5 MG PO TABS: 5.0000 mg | ORAL_TABLET | ORAL | Status: DC | PRN

## 2011-10-14 MED ORDER — ALBUTEROL SULFATE HFA 108 (90 BASE) MCG/ACT IN AERS
INHALATION_SPRAY | RESPIRATORY_TRACT | Status: DC | PRN
  Administered 2011-10-14: 1 via RESPIRATORY_TRACT
  Administered 2011-10-14: 2 via RESPIRATORY_TRACT

## 2011-10-14 MED ORDER — PROMETHAZINE HCL 25 MG/ML IJ SOLN: 6.2500 mg | INTRAMUSCULAR | Status: DC | PRN

## 2011-10-14 MED ORDER — ALBUTEROL SULFATE (5 MG/ML) 0.5% IN NEBU
2.5000 mg | INHALATION_SOLUTION | RESPIRATORY_TRACT | Status: AC
  Administered 2011-10-14: 2.5 mg via RESPIRATORY_TRACT

## 2011-10-14 MED ORDER — ACETAMINOPHEN 650 MG RE SUPP: 650.0000 mg | RECTAL | Status: DC | PRN

## 2011-10-14 MED ORDER — HYDROMORPHONE HCL PF 1 MG/ML IJ SOLN: 0.2500 mg | INTRAMUSCULAR | Status: DC | PRN

## 2011-10-14 MED ORDER — MIDAZOLAM HCL 2 MG/2ML IJ SOLN: 0.5000 mg | Freq: Once | INTRAMUSCULAR | Status: DC | PRN

## 2011-10-14 MED ORDER — ROCURONIUM BROMIDE 100 MG/10ML IV SOLN
INTRAVENOUS | Status: DC | PRN
  Administered 2011-10-14: 50 mg via INTRAVENOUS

## 2011-10-14 MED ORDER — FENTANYL CITRATE 0.05 MG/ML IJ SOLN
INTRAMUSCULAR | Status: DC | PRN
  Administered 2011-10-14 (×2): 50 ug via INTRAVENOUS
  Administered 2011-10-14: 100 ug via INTRAVENOUS

## 2011-10-14 MED ORDER — PROPOFOL 10 MG/ML IV EMUL
INTRAVENOUS | Status: DC | PRN
  Administered 2011-10-14: 80 mg via INTRAVENOUS

## 2011-10-14 MED ORDER — CEFAZOLIN SODIUM-DEXTROSE 2-3 GM-% IV SOLR
2.0000 g | INTRAVENOUS | Status: AC
  Administered 2011-10-14: 2 g via INTRAVENOUS

## 2011-10-14 MED ORDER — HYDROCODONE-ACETAMINOPHEN 5-325 MG PO TABS: 1.0000 | ORAL_TABLET | Freq: Four times a day (QID) | ORAL | Status: AC | PRN

## 2011-10-14 MED ORDER — SODIUM CHLORIDE 0.9 % IJ SOLN: 3.0000 mL | Freq: Two times a day (BID) | INTRAMUSCULAR | Status: DC

## 2011-10-14 MED ORDER — 0.9 % SODIUM CHLORIDE (POUR BTL) OPTIME
TOPICAL | Status: DC | PRN
  Administered 2011-10-14: 1000 mL

## 2011-10-14 MED ORDER — SODIUM CHLORIDE 0.9 % IR SOLN
Status: DC | PRN
  Administered 2011-10-14: 1000 mL

## 2011-10-14 MED ORDER — HYDROCODONE-ACETAMINOPHEN 5-325 MG PO TABS
1.0000 | ORAL_TABLET | Freq: Four times a day (QID) | ORAL | Status: DC | PRN
  Administered 2011-10-14: 1 via ORAL
  Filled 2011-10-14: qty 1

## 2011-10-14 MED ORDER — GLYCOPYRROLATE 0.2 MG/ML IJ SOLN
INTRAMUSCULAR | Status: DC | PRN
  Administered 2011-10-14: 0.2 mg via INTRAVENOUS
  Administered 2011-10-14: .5 mg via INTRAVENOUS

## 2011-10-14 MED ORDER — ARTIFICIAL TEARS OP OINT
TOPICAL_OINTMENT | OPHTHALMIC | Status: DC | PRN
  Administered 2011-10-14: 1 via OPHTHALMIC

## 2011-10-14 MED ORDER — MORPHINE SULFATE 4 MG/ML IJ SOLN: 4.0000 mg | INTRAMUSCULAR | Status: DC | PRN

## 2011-10-14 MED ORDER — LIDOCAINE HCL 4 % MT SOLN
OROMUCOSAL | Status: DC | PRN
  Administered 2011-10-14: 4 mL via TOPICAL

## 2011-10-14 MED ORDER — ONDANSETRON HCL 4 MG/2ML IJ SOLN: 4.0000 mg | Freq: Four times a day (QID) | INTRAMUSCULAR | Status: DC | PRN

## 2011-10-14 MED ORDER — ALBUTEROL SULFATE (5 MG/ML) 0.5% IN NEBU
INHALATION_SOLUTION | RESPIRATORY_TRACT | Status: AC
  Filled 2011-10-14: qty 0.5

## 2011-10-14 MED ORDER — MIDAZOLAM HCL 5 MG/5ML IJ SOLN
INTRAMUSCULAR | Status: DC | PRN
  Administered 2011-10-14: 2 mg via INTRAVENOUS

## 2011-10-14 SURGICAL SUPPLY — 45 items
APPLIER CLIP ROT 10 11.4 M/L (STAPLE) ×2
BLADE SURG ROTATE 9660 (MISCELLANEOUS) IMPLANT
CANISTER SUCTION 2500CC (MISCELLANEOUS) ×2 IMPLANT
CATH REDDICK CHOLANGI 4FR 50CM (CATHETERS) ×2 IMPLANT
CHLORAPREP W/TINT 26ML (MISCELLANEOUS) ×2 IMPLANT
CLIP APPLIE ROT 10 11.4 M/L (STAPLE) ×1 IMPLANT
CLOTH BEACON ORANGE TIMEOUT ST (SAFETY) ×2 IMPLANT
COVER MAYO STAND STRL (DRAPES) ×2 IMPLANT
COVER SURGICAL LIGHT HANDLE (MISCELLANEOUS) ×2 IMPLANT
DECANTER SPIKE VIAL GLASS SM (MISCELLANEOUS) ×4 IMPLANT
DERMABOND ADHESIVE PROPEN (GAUZE/BANDAGES/DRESSINGS) ×1
DERMABOND ADVANCED (GAUZE/BANDAGES/DRESSINGS) ×1
DERMABOND ADVANCED .7 DNX12 (GAUZE/BANDAGES/DRESSINGS) ×1 IMPLANT
DERMABOND ADVANCED .7 DNX6 (GAUZE/BANDAGES/DRESSINGS) ×1 IMPLANT
DRAPE C-ARM 42X72 X-RAY (DRAPES) ×2 IMPLANT
DRAPE UTILITY 15X26 W/TAPE STR (DRAPE) ×4 IMPLANT
ELECT COATED BLADE 2.86 ST (ELECTRODE) ×2 IMPLANT
ELECT REM PT RETURN 9FT ADLT (ELECTROSURGICAL) ×2
ELECTRODE REM PT RTRN 9FT ADLT (ELECTROSURGICAL) ×1 IMPLANT
GLOVE BIO SURGEON STRL SZ 6 (GLOVE) ×2 IMPLANT
GLOVE BIO SURGEON STRL SZ7.5 (GLOVE) ×2 IMPLANT
GLOVE BIOGEL PI IND STRL 6.5 (GLOVE) ×1 IMPLANT
GLOVE BIOGEL PI IND STRL 7.0 (GLOVE) ×1 IMPLANT
GLOVE BIOGEL PI INDICATOR 6.5 (GLOVE) ×1
GLOVE BIOGEL PI INDICATOR 7.0 (GLOVE) ×1
GLOVE SURG SS PI 7.0 STRL IVOR (GLOVE) ×2 IMPLANT
GOWN PREVENTION PLUS XXLARGE (GOWN DISPOSABLE) ×2 IMPLANT
GOWN STRL NON-REIN LRG LVL3 (GOWN DISPOSABLE) ×8 IMPLANT
IV CATH 14GX2 1/4 (CATHETERS) ×2 IMPLANT
KIT BASIN OR (CUSTOM PROCEDURE TRAY) ×2 IMPLANT
KIT ROOM TURNOVER OR (KITS) ×2 IMPLANT
NS IRRIG 1000ML POUR BTL (IV SOLUTION) ×2 IMPLANT
PAD ARMBOARD 7.5X6 YLW CONV (MISCELLANEOUS) ×2 IMPLANT
POUCH SPECIMEN RETRIEVAL 10MM (ENDOMECHANICALS) ×2 IMPLANT
SCISSORS LAP 5X35 DISP (ENDOMECHANICALS) IMPLANT
SET IRRIG TUBING LAPAROSCOPIC (IRRIGATION / IRRIGATOR) ×2 IMPLANT
SLEEVE ENDOPATH XCEL 5M (ENDOMECHANICALS) ×2 IMPLANT
SPECIMEN JAR SMALL (MISCELLANEOUS) ×2 IMPLANT
SUT MNCRL AB 4-0 PS2 18 (SUTURE) ×2 IMPLANT
TOWEL OR 17X24 6PK STRL BLUE (TOWEL DISPOSABLE) ×2 IMPLANT
TOWEL OR 17X26 10 PK STRL BLUE (TOWEL DISPOSABLE) ×2 IMPLANT
TRAY LAPAROSCOPIC (CUSTOM PROCEDURE TRAY) ×2 IMPLANT
TROCAR XCEL BLUNT TIP 100MML (ENDOMECHANICALS) ×2 IMPLANT
TROCAR XCEL NON-BLD 11X100MML (ENDOMECHANICALS) ×2 IMPLANT
TROCAR XCEL NON-BLD 5MMX100MML (ENDOMECHANICALS) ×2 IMPLANT

## 2011-10-14 NOTE — Progress Notes (Signed)
Report given to Akesha Uresti rn as caregiver 

## 2011-10-14 NOTE — Transfer of Care (Signed)
Immediate Anesthesia Transfer of Care Note  Patient: Denise [name]  Procedure(s) Performed: Procedure(s) (LRB): LAPAROSCOPIC CHOLECYSTECTOMY WITH INTRAOPERATIVE CHOLANGIOGRAM (N/A)  Patient Location: PACU  Anesthesia Type: General  Level of Consciousness: awake, alert  and oriented  Airway & Oxygen Therapy: Patient Spontanous Breathing and Patient connected to nasal cannula oxygen  Post-op Assessment: Report given to PACU RN and Post -op Vital signs reviewed and stable  Post vital signs: Reviewed and stable  Complications: No apparent anesthesia complications

## 2011-10-14 NOTE — Anesthesia Procedure Notes (Signed)
Procedure Name: Intubation Date/Time: 10/14/2011 7:42 AM Performed by: Margaree Mackintosh Pre-anesthesia Checklist: Patient identified, Timeout performed, Emergency Drugs available, Suction available and Patient being monitored Patient Re-evaluated:Patient Re-evaluated prior to inductionOxygen Delivery Method: Circle system utilized Preoxygenation: Pre-oxygenation with 100% oxygen Intubation Type: IV induction Ventilation: Mask ventilation without difficulty and Oral airway inserted - appropriate to patient size Laryngoscope Size: Hyacinth Meeker and 2 Grade View: Grade II Tube type: Oral Tube size: 7.0 mm Number of attempts: 1 Airway Equipment and Method: Stylet and LTA kit utilized Placement Confirmation: ETT inserted through vocal cords under direct vision,  positive ETCO2 and breath sounds checked- equal and bilateral Secured at: 22 cm Tube secured with: Tape Dental Injury: Teeth and Oropharynx as per pre-operative assessment  Comments: Small abrasion on upper lip ointment applied

## 2011-10-14 NOTE — Anesthesia Postprocedure Evaluation (Signed)
  Anesthesia Post-op Note  Patient: Denise [name]  Procedure(s) Performed: Procedure(s) (LRB): LAPAROSCOPIC CHOLECYSTECTOMY WITH INTRAOPERATIVE CHOLANGIOGRAM (N/A)  Patient Location: PACU  Anesthesia Type: General  Level of Consciousness: awake, alert  and oriented  Airway and Oxygen Therapy: Patient Spontanous Breathing and Patient connected to nasal cannula oxygen  Post-op Pain: mild  Post-op Assessment: Post-op Vital signs reviewed, Patient's Cardiovascular Status Stable, Respiratory Function Stable, Patent Airway, No signs of Nausea or vomiting and Pain level controlled  Post-op Vital Signs: Reviewed and stable  Complications: No apparent anesthesia complications

## 2011-10-14 NOTE — Op Note (Signed)
10/14/2011  8:45 AM  PATIENT:  Denise [name]  66 y.o. female  PRE-OPERATIVE DIAGNOSIS:  gallstones  POST-OPERATIVE DIAGNOSIS:  gallstones  PROCEDURE:  Procedure(s) (LRB): LAPAROSCOPIC CHOLECYSTECTOMY WITH INTRAOPERATIVE CHOLANGIOGRAM (N/A)  SURGEON:  Surgeon(s) and Role:    * Robyne Askew, MD - Primary    * Almond Lint, MD - Assisting  PHYSICIAN ASSISTANT:   ASSISTANTS: Dr. Donell Beers   ANESTHESIA:   general  EBL:     BLOOD ADMINISTERED:none  DRAINS: none   LOCAL MEDICATIONS USED:  MARCAINE     SPECIMEN:  Source of Specimen:  gallbladder  DISPOSITION OF SPECIMEN:  PATHOLOGY  COUNTS:  YES  TOURNIQUET:  * No tourniquets in log *  DICTATION: .Dragon Dictation @opnoteheader @  Procedure: After informed consent was obtained the patient was brought to the operating room and placed in the supine position on the operating room table. After adequate induction of general anesthesia the patient's abdomen was prepped with ChloraPrep allowed to dry and draped in usual sterile manner. The area below the umbilicus was infiltrated with quarter percent  Marcaine. A small incision was made with a 15 blade knife. The incision was carried down through the subcutaneous tissue bluntly with a hemostat and Army-Navy retractors. The linea alba was identified. The linea alba was incised with a 15 blade knife and each side was grasped with Coker clamps. The preperitoneal space was then probed with a hemostat until the peritoneum was opened and access was gained to the abdominal cavity. A 0 Vicryl pursestring stitch was placed in the fascia surrounding the opening. A Hassan cannula was then placed through the opening and anchored in place with the previously placed Vicryl purse string stitch. The abdomen was insufflated with carbon dioxide without difficulty. A laparoscope was inserted through the [facility] cannula in the right upper quadrant was inspected. Next the epigastric region was infiltrated with  % Marcaine. A small incision was made with a 15 blade knife. A 10 mm port was placed bluntly through this incision into the abdominal cavity under direct vision. Next 2 sites were chosen laterally on the right side of the abdomen for placement of 5 mm ports. Each of these areas was infiltrated with quarter percent Marcaine. Small stab incisions were made with a 15 blade knife. 5 mm ports were then placed bluntly through these incisions into the abdominal cavity under direct vision without difficulty. A blunt grasper was placed through the lateralmost 5 mm port and used to grasp the dome of the gallbladder and elevated anteriorly and superiorly. Another blunt grasper was placed through the other 5 mm port and used to retract the body and neck of the gallbladder. A dissector was placed through the epigastric port and using the electrocautery the peritoneal reflection at the gallbladder neck was opened. Blunt dissection was then carried out in this area until the gallbladder neck-cystic duct junction was readily identified and a good window was created. A single clip was placed on the gallbladder neck. A small  ductotomy was made just below the clip with laparoscopic scissors. A 14-gauge Angiocath was then placed through the anterior abdominal wall under direct vision. A Reddick cholangiogram catheter was then placed through the Angiocath and flushed. The catheter was then placed in the cystic duct and anchored in place with a clip. A cholangiogram was obtained that showed no filling defects good emptying into the duodenum an adequate length on the cystic duct. The anchoring clip and catheters were then removed from the patient.  3 clips were placed proximally on the cystic duct and the duct was divided between the 2 sets of clips. Posterior to this the cystic artery was identified and again dissected bluntly in a circumferential manner until a good window  was created. 2 clips were placed proximally and one distally  on the artery and the artery was divided between the 2 sets of clips. Next a laparoscopic hook cautery device was used to separate the gallbladder from the liver bed. Prior to completely detaching the gallbladder from the liver bed the liver bed was inspected and several small bleeding points were coagulated with the electrocautery until the area was completely hemostatic. The gallbladder was then detached the rest of it from the liver bed without difficulty. A laparoscopic bag was inserted through the epigastric port. The gallbladder was placed within the bag and the bag was sealed. A laparoscope was then moved to the epigastric port. The gallbladder grasper was placed through the [facility] cannula and used to grasp the opening of the bag. The bag with the gallbladder was then removed with the [facility] cannula through the infraumbilical port without difficulty. The fascial defect was then closed with the previously placed Vicryl pursestring stitch as well as with another figure-of-eight 0 Vicryl stitch. The liver bed was inspected again and found to be hemostatic. The abdomen was irrigated with copious amounts of saline until the effluent was clear. The ports were then removed under direct vision without difficulty and were found to be hemostatic. The gas was allowed to escape. The skin incisions were all closed with interrupted 4-0 Monocryl subcuticular stitches. Dermabond dressings were applied. The patient tolerated the procedure well. At the end of the case all needle sponge and instrument counts were correct. The patient was then awakened and taken to recovery in stable condition   PLAN OF CARE: Discharge to home after PACU  PATIENT DISPOSITION:  PACU - hemodynamically stable.   Delay start of Pharmacological VTE agent (>24hrs) due to surgical blood loss or risk of bleeding: not applicable

## 2011-10-14 NOTE — Interval H&P Note (Signed)
History and Physical Interval Note:  10/14/2011 7:23 AM  Denise [name]  has presented today for surgery, with the diagnosis of gallstones  The various methods of treatment have been discussed with the patient and family. After consideration of risks, benefits and other options for treatment, the patient has consented to  Procedure(s) (LRB): LAPAROSCOPIC CHOLECYSTECTOMY WITH INTRAOPERATIVE CHOLANGIOGRAM (N/A) as a surgical intervention .  The patient's history has been reviewed, patient examined, no change in status, stable for surgery.  I have reviewed the patient's chart and labs.  Questions were answered to the patient's satisfaction.     TOTH III,PAUL S

## 2011-10-14 NOTE — Progress Notes (Signed)
Pt wheezing on waking up dr Jean Rosenthal called orders obtained and carried out albuterol rx given breathing clearer after rx

## 2011-10-14 NOTE — H&P (Signed)
Denise [name]  Description:  66 year old female  09/02/2011 3:40 PM Office Visit Provider:  Robyne Askew, MD  MRN: [id number] Department:  [facility]            Diagnoses  Reason for Visit    Cholelithiasis - Primary  Follow-up   574.20  reck gallbladder           Vitals - Last Recorded       BP  Pulse  Temp  Ht  Wt  BMI    140/74  68  98.2 F (36.8 C) (Temporal)  5\' 4"  (1.626 m)  152 lb 12.8 oz (69.31 kg)  26.23 kg/m2          SpO2               97%                   Progress Notes     Robyne Askew, MD 09/02/2011 4:52 PM Signed    Subjective:    Patient ID: Denise [name], female DOB: [birth year], 66 y.o. MRN: [id number]  HPI  The patient is a 66 year old white female who we saw a year and a half ago with symptomatic gallstones. We tried to schedule her for surgery but some family matters kept her from scheduling. During the last year and a half she does get some occasional pain in her right upper quadrant. She's had a couple episodes that she would describe as severe. She denies any nausea or vomiting. She does have diarrhea every day. She is followed by Dr. Clarene Duke and cardiology and has not had to have a pacemaker placed since we saw her last  Review of Systems  Constitutional: Negative.  HENT: Negative.  Eyes: Negative.  Respiratory: Negative.  Cardiovascular: Negative.  Gastrointestinal: Positive for abdominal pain and diarrhea.  Genitourinary: Negative.  Musculoskeletal: Negative.  Skin: Negative.  Neurological: Negative.  Hematological: Negative.  Psychiatric/Behavioral: Negative.      Objective:    Physical Exam  Constitutional: She is oriented to person, place, and time. She appears well-nourished.  HENT:  Head: Normocephalic and atraumatic.  Eyes: Conjunctivae and EOM are normal. Pupils are equal, round, and reactive to light.  Neck: Normal range of motion. Neck supple.  Cardiovascular: Normal rate and regular  rhythm.  Murmur heard.  Pulmonary/Chest: Effort normal and breath sounds normal.  Abdominal: Soft. Bowel sounds are normal.  She has mild right upper quadrant pain. no palpable mass  Musculoskeletal: Normal range of motion.  Neurological: She is alert and oriented to person, place, and time.  Skin: Skin is warm and dry.  Psychiatric: She has a normal mood and affect. Her behavior is normal.      Assessment:     The patient is a 66 year old white female with what sounds like symptomatic gallstones as well as some cardiac history. I do think removing her gallbladder will help her right upper quadrant pain. It is not clear to me that removing her gallbladder will help her diarrhea. I have discussed with her in detail the risks and benefits of the operation to remove the gallbladder as well as some of the technical aspects and she understands and wishes to proceed. We will obtain cardiac clearance again from Dr. little since it has been a year and a half.     Plan:     Plan for laparoscopic cholecystectomy  with intraoperative cholangiogram once we have cardiac clearance              Not recorded                  Discontinued Medications         Reason for Discontinue    BYSTOLIC 5 MG tablet  Error                    Level of Service     PR OFFICE OUTPATIENT VISIT 15 MINUTES [99213]           All Flowsheet Templates (all recorded)     Encounter Vitals Flowsheet   Custom Formula Data Flowsheet   Anthropometrics Flowsheet                                      All Charges for This Encounter       Code  Description  Service Date  Service Provider  Modifiers  Quantity    [phone]  PR OFFICE OUTPATIENT VISIT 15 MINUTES  09/02/2011  Robyne Askew, MD   1                Other Encounter Related Information     Allergies & Medications      Problem List      History      Patient-Entered Questionnaires      Printed AVS  Reports       Printed at    Printed by    09/02/2011 4:00 PM  After Visit Summary  Robyne Askew, MD           No data filed

## 2011-10-14 NOTE — Preoperative (Signed)
Beta Blockers   Reason not to administer Beta Blockers:Not Applicable 

## 2011-10-14 NOTE — Anesthesia Preprocedure Evaluation (Signed)
Anesthesia Evaluation  Patient identified by MRN, date of birth, ID band Patient awake    Reviewed: Allergy & Precautions, H&P , NPO status , Patient's Chart, lab work & pertinent test results, reviewed documented beta blocker date and time   History of Anesthesia Complications Negative for: history of anesthetic complications  Airway Mallampati: II TM Distance: >3 FB Neck ROM: Full    Dental No notable dental hx. (+) Teeth Intact and Dental Advisory Given   Pulmonary shortness of breath, COPDCurrent Smoker,  breath sounds clear to auscultation  Pulmonary exam normal       Cardiovascular hypertension, Pt. on medications and Pt. on home beta blockers + CAD (s/p CABG '09), + Past MI, + Cardiac Stents and + CABG Rhythm:Regular Rate:Normal  7/13 stress test: fixed perfusion defect, EF 40-45%   Neuro/Psych TIAnegative psych ROS   GI/Hepatic Neg liver ROS, GERD-  Medicated and Controlled,  Endo/Other  negative endocrine ROS  Renal/GU negative Renal ROS     Musculoskeletal   Abdominal   Peds  Hematology  (+) REFUSES BLOOD PRODUCTS,   Anesthesia Other Findings   Reproductive/Obstetrics                           Anesthesia Physical Anesthesia Plan  ASA: III  Anesthesia Plan: General   Post-op Pain Management:    Induction: Intravenous  Airway Management Planned: Oral ETT  Additional Equipment:   Intra-op Plan:   Post-operative Plan: Extubation in OR  Informed Consent: I have reviewed the patients History and Physical, chart, labs and discussed the procedure including the risks, benefits and alternatives for the proposed anesthesia with the patient or authorized representative who has indicated his/her understanding and acceptance.   Dental advisory given  Plan Discussed with: CRNA and Surgeon  Anesthesia Plan Comments: (Plan routine monitors, GETA)        Anesthesia Quick  Evaluation

## 2011-10-15 ENCOUNTER — Encounter (HOSPITAL_COMMUNITY): Payer: Self-pay | Admitting: General Surgery

## 2011-11-01 ENCOUNTER — Ambulatory Visit (INDEPENDENT_AMBULATORY_CARE_PROVIDER_SITE_OTHER): Payer: Medicare Other | Admitting: General Surgery

## 2011-11-01 ENCOUNTER — Encounter (INDEPENDENT_AMBULATORY_CARE_PROVIDER_SITE_OTHER): Payer: Self-pay | Admitting: General Surgery

## 2011-11-01 VITALS — BP 122/60 | HR 64 | Temp 97.8°F | Resp 16 | Ht 64.0 in | Wt 154.0 lb

## 2011-11-01 DIAGNOSIS — K8020 Calculus of gallbladder without cholecystitis without obstruction: Secondary | ICD-10-CM

## 2011-11-01 NOTE — Patient Instructions (Signed)
No heavy lifting for 2 more weeks then you can return to normal activities

## 2011-11-01 NOTE — Progress Notes (Signed)
Subjective:     Patient ID: DELAYZA LUNGREN, female   DOB: 1945-09-04, 66 y.o.   MRN: [id number]  HPI The patient is a 66 year old white female who is just over 2 weeks out from a laparoscopic cholecystectomy for cholecystitis and cholelithiasis. She is doing very well. She denies any abdominal pain. Her appetite is good no bowels are working normally.  Review of Systems     Objective:   Physical Exam On exam her abdomen is soft and nontender. Her incisions are healing nicely with no sign of infection.    Assessment:     Status post laparoscopic cholecystectomy    Plan:     At this point I would like her to refrain from heavy lifting for another 2 weeks and then she can return to normal activities. We will plan to see her back on a when necessary basis

## 2011-12-16 ENCOUNTER — Other Ambulatory Visit: Payer: Self-pay

## 2012-03-24 ENCOUNTER — Encounter: Payer: Self-pay | Admitting: Pharmacist Clinician (PhC)/ Clinical Pharmacy Specialist

## 2012-08-23 ENCOUNTER — Other Ambulatory Visit (HOSPITAL_COMMUNITY): Payer: Self-pay | Admitting: Internal Medicine

## 2012-08-23 DIAGNOSIS — I7789 Other specified disorders of arteries and arterioles: Secondary | ICD-10-CM

## 2012-08-28 ENCOUNTER — Ambulatory Visit (HOSPITAL_COMMUNITY)
Admission: RE | Admit: 2012-08-28 | Discharge: 2012-08-28 | Disposition: A | Discharge status: Home / Self Care | Payer: Medicare Other | Source: Ambulatory Visit | Attending: Internal Medicine | Admitting: Internal Medicine

## 2012-08-28 ENCOUNTER — Other Ambulatory Visit (HOSPITAL_COMMUNITY): Payer: Self-pay | Admitting: Internal Medicine

## 2012-08-28 DIAGNOSIS — R52 Pain, unspecified: Secondary | ICD-10-CM

## 2012-08-28 DIAGNOSIS — I7789 Other specified disorders of arteries and arterioles: Secondary | ICD-10-CM

## 2012-08-28 DIAGNOSIS — Z8673 Personal history of transient ischemic attack (TIA), and cerebral infarction without residual deficits: Secondary | ICD-10-CM | POA: Insufficient documentation

## 2012-08-28 DIAGNOSIS — M79609 Pain in unspecified limb: Secondary | ICD-10-CM | POA: Insufficient documentation

## 2012-08-28 DIAGNOSIS — I2510 Atherosclerotic heart disease of native coronary artery without angina pectoris: Secondary | ICD-10-CM

## 2012-08-28 DIAGNOSIS — I10 Essential (primary) hypertension: Secondary | ICD-10-CM | POA: Insufficient documentation

## 2012-08-28 DIAGNOSIS — I252 Old myocardial infarction: Secondary | ICD-10-CM | POA: Insufficient documentation

## 2012-08-28 DIAGNOSIS — I658 Occlusion and stenosis of other precerebral arteries: Secondary | ICD-10-CM | POA: Insufficient documentation

## 2012-08-28 DIAGNOSIS — F17200 Nicotine dependence, unspecified, uncomplicated: Secondary | ICD-10-CM | POA: Insufficient documentation

## 2012-08-28 DIAGNOSIS — I6529 Occlusion and stenosis of unspecified carotid artery: Secondary | ICD-10-CM | POA: Insufficient documentation

## 2012-09-01 ENCOUNTER — Other Ambulatory Visit: Payer: Self-pay | Admitting: *Deleted

## 2012-09-01 DIAGNOSIS — M79609 Pain in unspecified limb: Secondary | ICD-10-CM

## 2012-09-06 ENCOUNTER — Other Ambulatory Visit: Payer: Self-pay | Admitting: *Deleted

## 2012-09-06 DIAGNOSIS — I70219 Atherosclerosis of native arteries of extremities with intermittent claudication, unspecified extremity: Secondary | ICD-10-CM

## 2012-10-04 ENCOUNTER — Encounter: Payer: Self-pay | Admitting: Vascular Surgery

## 2012-10-05 ENCOUNTER — Ambulatory Visit (INDEPENDENT_AMBULATORY_CARE_PROVIDER_SITE_OTHER): Payer: Medicare Other | Admitting: Vascular Surgery

## 2012-10-05 ENCOUNTER — Encounter: Payer: Self-pay | Admitting: Vascular Surgery

## 2012-10-05 ENCOUNTER — Encounter (INDEPENDENT_AMBULATORY_CARE_PROVIDER_SITE_OTHER): Payer: Medicare Other | Admitting: *Deleted

## 2012-10-05 VITALS — BP 124/55 | HR 50 | Ht 64.0 in | Wt 160.5 lb

## 2012-10-05 DIAGNOSIS — I739 Peripheral vascular disease, unspecified: Secondary | ICD-10-CM

## 2012-10-05 DIAGNOSIS — I70219 Atherosclerosis of native arteries of extremities with intermittent claudication, unspecified extremity: Secondary | ICD-10-CM | POA: Insufficient documentation

## 2012-10-05 DIAGNOSIS — Z48812 Encounter for surgical aftercare following surgery on the circulatory system: Secondary | ICD-10-CM

## 2012-10-05 NOTE — Progress Notes (Signed)
[facility] [place] HISTORY AND PHYSICAL   History of Present Illness:  Patient is a 67 y.o. year old female who presents for evaluation of right leg pain.  She develops pain in the calf in her right leg that occurs after walking 50-60 feet. This is been present for approximately one year. The pain is relieved for 5 minutes of rest. She has no symptoms in the left leg. She has had no prior problems with nonhealing wounds or ulcers on the feet. She denies rest pain. She currently is smoking one half pack per day of cigarettes. Greater than 3 minutes they were spent regarding smoking cessation counseling.  Other medical problems include hyperlipidemia, hypertension, coronary disease, prior history of stroke with residual left arm weakness (no significant stenosis on carotid duplex 2013). These are all currently stable.  Past Medical History  Diagnosis Date  . GERD (gastroesophageal reflux disease)   . Hyperlipidemia   . Hypertension   . Heart attack   . Coronary artery disease 09/09/2011    stress - non-gated; nod/severe perfusion defect in basal/mid inferior, basa/mid inferolateral and apical lateral regions consistient w. scar/infarct; 2009 stress test EF 53% mild/mod perfusion defect  . Shortness of breath   . Stroke     many years ago, "light stroke"  . Weakness of left leg 01/20/2004    left arm/leg both weak, normal echo, no signs emboli  . Cerebral atherosclerosis 02/19/2010    doppler -L ICA mid 0-49% reduction  . Bradycardia, drug induced     resolved w/ d/c bystolic  . Gall bladder disease   . PAF (paroxysmal atrial fibrillation)     post operative in 2009, no recurrences  . COPD (chronic obstructive pulmonary disease)     Past Surgical History  Procedure Laterality Date  . Abdominal hysterectomy  1978, 1982    partial, then complete  . Coronary artery bypass graft  01/2008    x4; LIMA to LAD, SVG to diagonal, SVG to circumflex marginal, SVG to RCA with EVH  of R lower extremity   . Cholecystectomy  10/14/2011    Procedure: LAPAROSCOPIC CHOLECYSTECTOMY WITH INTRAOPERATIVE CHOLANGIOGRAM;  Surgeon: Robyne Askew, MD;  Location: [facility] OR;  Service: General;  Laterality: N/A;  . Tee without cardioversion  02/07/1999    used intraoperatively w/ CABG to assess LV function,   . Cardiac catheterization  12/15/20091    severe 3 vessel disease, needing CABG, EF 40-45%   left subclavian stent by Dr. Alanda Amass in 1999.   Social History History  Substance Use Topics  . Smoking status: Current Every Day Smoker -- 0.50 packs/day    Types: Cigarettes  . Smokeless tobacco: Never Used  . Alcohol Use: No     Comment: occasional    Family History Family History  Problem Relation Age of Onset  . Heart failure Mother   . Heart disease Mother   . Hyperlipidemia Mother   . Cancer Father     bone    Allergies  Allergies  Allergen Reactions  . Beta Adrenergic Blockers Other (See Comments)    Significant bradycardia  . Codeine Nausea Only    dizziness  . Statins Other (See Comments)    Muscle aches w/ statins  . Zetia [Ezetimibe] Other (See Comments)    Muscle aches     Current Outpatient Prescriptions  Medication Sig Dispense Refill  . amLODipine (NORVASC) 5 MG tablet Take 5 mg by mouth daily.       Marland Kitchen  aspirin 81 MG tablet Take 81 mg by mouth daily.      . cetirizine (ZYRTEC) 10 MG tablet Take 10 mg by mouth daily.      . citalopram (CELEXA) 40 MG tablet Take 40 mg by mouth daily.       Marland Kitchen LORazepam (ATIVAN) 0.5 MG tablet Take 0.5 mg by mouth every 8 (eight) hours as needed. For pain      . MICARDIS HCT 40-12.5 MG per tablet Take 1 tablet by mouth daily.       . Omega-3 Fatty Acids (FISH OIL) 1000 MG CAPS Take 2 capsules by mouth daily.      Marland Kitchen omeprazole (PRILOSEC) 20 MG capsule Take 20 mg by mouth daily.       Marland Kitchen ZETIA 10 MG tablet Take 1 tablet by mouth daily.      . furosemide (LASIX) 20 MG tablet Take 20 mg by mouth as needed.       No  current facility-administered medications for this visit.    ROS:   General:  No weight loss, Fever, chills  HEENT: No recent headaches, no nasal bleeding, no visual changes, no sore throat  Neurologic: No dizziness, blackouts, seizures. No recent symptoms of stroke or mini- stroke. No recent episodes of slurred speech, or temporary blindness.  Cardiac: No recent episodes of chest pain/pressure, no shortness of breath at rest.  + shortness of breath with exertion.  Denies history of atrial fibrillation or irregular heartbeat  Vascular: No history of rest pain in feet.  No history of claudication.  No history of non-healing ulcer, No history of DVT   Pulmonary: No home oxygen, no productive cough, no hemoptysis,  + wheezing  Musculoskeletal:  [ ]  Arthritis, [ ]  Low back pain,  [ ]  Joint pain  Hematologic:No history of hypercoagulable state.  No history of easy bleeding.  No history of anemia  Gastrointestinal: No hematochezia or melena,  No gastroesophageal reflux, no trouble swallowing  Urinary: [ ]  chronic Kidney disease, [ ]  on HD - [ ]  MWF or [ ]  TTHS, [ ]  Burning with urination, [ ]  Frequent urination, [ ]  Difficulty urinating;   Skin: No rashes  Psychological: No history of anxiety,  No history of depression   Physical Examination  Filed Vitals:   10/05/12 0926  BP: 124/55  Pulse: 50  Height: 5\' 4"  (1.626 m)  Weight: 160 lb 8 oz (72.802 kg)  SpO2: 100%    Body mass index is 27.54 kg/(m^2).  General:  Alert and oriented, no acute distress HEENT: Normal Neck: No bruit or JVD Pulmonary: Clear to auscultation bilaterally Cardiac: Regular Rate and Rhythm without murmur Abdomen: Soft, non-tender, non-distended, no mass Skin: No rash Extremity Pulses:  2+ radial, brachial right side, one plus brachial and radial pulse left side, right arm pressure 162/93, left arm pressure 143/55 , 2+ femoral, absent dorsalis pedis, posterior tibial pulses bilaterally Musculoskeletal:  No deformity or edema  Neurologic: Upper and lower extremity motor 5/5 and symmetric  DATA: The patient had bilateral ABIs performed today which were 0.62 on the right 0.94 on the left the patient also had an arterial duplex scan of the right leg which showed a mid superficial femoral artery stenosis. I reviewed and interpreted this study.   ASSESSMENT: Right lower extremity claudication.  PLAN:  I discussed with the patient today smoking cessation and a walking program. I also discussed with her the possibility of arteriogram and possible intervention with potentially SFA stenting. I  discussed with her the risks and benefits of an intervention as well as the benefits of a conservative program of walking and smoking cessation. Currently she has opted for conservative management with a walking program and smoking cessation. She will call me back if she wishes to change this plan to proceed with arteriogram.  Fabienne Bruns, MD [facility] [place] Office: [phone] Pager: [phone]

## 2012-10-12 ENCOUNTER — Encounter (HOSPITAL_COMMUNITY): Payer: Self-pay | Admitting: Pharmacy Technician

## 2012-10-12 ENCOUNTER — Other Ambulatory Visit: Payer: Self-pay

## 2012-10-13 ENCOUNTER — Ambulatory Visit (HOSPITAL_COMMUNITY)
Admission: RE | Admit: 2012-10-13 | Discharge: 2012-10-13 | Disposition: A | Discharge status: Home / Self Care | Payer: Medicare Other | Source: Ambulatory Visit | Attending: Vascular Surgery | Admitting: Vascular Surgery

## 2012-10-13 ENCOUNTER — Telehealth: Payer: Self-pay | Admitting: Vascular Surgery

## 2012-10-13 ENCOUNTER — Other Ambulatory Visit: Payer: Self-pay | Admitting: *Deleted

## 2012-10-13 ENCOUNTER — Encounter (HOSPITAL_COMMUNITY)
Admission: RE | Disposition: A | Discharge status: Home / Self Care | Payer: Self-pay | Source: Ambulatory Visit | Attending: Vascular Surgery

## 2012-10-13 DIAGNOSIS — I69959 Hemiplegia and hemiparesis following unspecified cerebrovascular disease affecting unspecified side: Secondary | ICD-10-CM | POA: Insufficient documentation

## 2012-10-13 DIAGNOSIS — K219 Gastro-esophageal reflux disease without esophagitis: Secondary | ICD-10-CM | POA: Insufficient documentation

## 2012-10-13 DIAGNOSIS — Z79899 Other long term (current) drug therapy: Secondary | ICD-10-CM | POA: Insufficient documentation

## 2012-10-13 DIAGNOSIS — J4489 Other specified chronic obstructive pulmonary disease: Secondary | ICD-10-CM | POA: Insufficient documentation

## 2012-10-13 DIAGNOSIS — J449 Chronic obstructive pulmonary disease, unspecified: Secondary | ICD-10-CM | POA: Insufficient documentation

## 2012-10-13 DIAGNOSIS — Z888 Allergy status to other drugs, medicaments and biological substances status: Secondary | ICD-10-CM | POA: Insufficient documentation

## 2012-10-13 DIAGNOSIS — E785 Hyperlipidemia, unspecified: Secondary | ICD-10-CM | POA: Insufficient documentation

## 2012-10-13 DIAGNOSIS — I70219 Atherosclerosis of native arteries of extremities with intermittent claudication, unspecified extremity: Secondary | ICD-10-CM

## 2012-10-13 DIAGNOSIS — I10 Essential (primary) hypertension: Secondary | ICD-10-CM | POA: Insufficient documentation

## 2012-10-13 DIAGNOSIS — I739 Peripheral vascular disease, unspecified: Secondary | ICD-10-CM

## 2012-10-13 DIAGNOSIS — I252 Old myocardial infarction: Secondary | ICD-10-CM | POA: Insufficient documentation

## 2012-10-13 DIAGNOSIS — I7092 Chronic total occlusion of artery of the extremities: Secondary | ICD-10-CM | POA: Insufficient documentation

## 2012-10-13 DIAGNOSIS — Z7982 Long term (current) use of aspirin: Secondary | ICD-10-CM | POA: Insufficient documentation

## 2012-10-13 DIAGNOSIS — Z885 Allergy status to narcotic agent status: Secondary | ICD-10-CM | POA: Insufficient documentation

## 2012-10-13 DIAGNOSIS — F17200 Nicotine dependence, unspecified, uncomplicated: Secondary | ICD-10-CM | POA: Insufficient documentation

## 2012-10-13 HISTORY — PX: ABDOMINAL ANGIOGRAM: SHX5499

## 2012-10-13 LAB — POCT I-STAT, CHEM 8
Calcium, Ion: 1.28 mmol/L (ref 1.13–1.30)
Glucose, Bld: 138 mg/dL — ABNORMAL HIGH (ref 70–99)
HCT: 43 % (ref 36.0–46.0)
Hemoglobin: 14.6 g/dL (ref 12.0–15.0)
TCO2: 23 mmol/L (ref 0–100)

## 2012-10-13 SURGERY — ABDOMINAL ANGIOGRAM
Anesthesia: LOCAL

## 2012-10-13 MED ORDER — LIDOCAINE HCL (PF) 1 % IJ SOLN
INTRAMUSCULAR | Status: AC
  Filled 2012-10-13: qty 30

## 2012-10-13 MED ORDER — OXYCODONE HCL 5 MG PO TABS: 5.0000 mg | ORAL_TABLET | ORAL | Status: DC | PRN

## 2012-10-13 MED ORDER — SODIUM CHLORIDE 0.45 % IV SOLN: INTRAVENOUS | Status: DC

## 2012-10-13 MED ORDER — MORPHINE SULFATE 10 MG/ML IJ SOLN: 2.0000 mg | INTRAMUSCULAR | Status: DC | PRN

## 2012-10-13 MED ORDER — ACETAMINOPHEN 325 MG PO TABS: 325.0000 mg | ORAL_TABLET | ORAL | Status: DC | PRN

## 2012-10-13 MED ORDER — ACETAMINOPHEN 325 MG RE SUPP: 325.0000 mg | RECTAL | Status: DC | PRN

## 2012-10-13 MED ORDER — FENTANYL CITRATE 0.05 MG/ML IJ SOLN
INTRAMUSCULAR | Status: AC
  Filled 2012-10-13: qty 2

## 2012-10-13 MED ORDER — HYDRALAZINE HCL 20 MG/ML IJ SOLN: 10.0000 mg | Freq: Four times a day (QID) | INTRAMUSCULAR | Status: DC | PRN

## 2012-10-13 MED ORDER — PHENOL 1.4 % MT LIQD: 1.0000 | OROMUCOSAL | Status: DC | PRN

## 2012-10-13 MED ORDER — MIDAZOLAM HCL 2 MG/2ML IJ SOLN
INTRAMUSCULAR | Status: AC
  Filled 2012-10-13: qty 2

## 2012-10-13 MED ORDER — SODIUM CHLORIDE 0.9 % IV SOLN
INTRAVENOUS | Status: DC
  Administered 2012-10-13: 09:00:00 via INTRAVENOUS

## 2012-10-13 MED ORDER — GUAIFENESIN-DM 100-10 MG/5ML PO SYRP: 15.0000 mL | ORAL_SOLUTION | ORAL | Status: DC | PRN

## 2012-10-13 MED ORDER — HEPARIN (PORCINE) IN NACL 2-0.9 UNIT/ML-% IJ SOLN
INTRAMUSCULAR | Status: AC
  Filled 2012-10-13: qty 1000

## 2012-10-13 MED ORDER — ONDANSETRON HCL 4 MG/2ML IJ SOLN: 4.0000 mg | Freq: Four times a day (QID) | INTRAMUSCULAR | Status: DC | PRN

## 2012-10-13 NOTE — Telephone Encounter (Addendum)
Message copied by Fredrich Birks on Fri Oct 13, 2012  1:35 PM ------      Message from: Sherren Kerns      Created: Fri Oct 13, 2012 11:22 AM       Aortogram with bilat runoff      Ultrasound            She wants to think about Right fem pop bypass            Please schedule her to see me early November  With repeat ABI            Charles ------  10/13/12: lm for patient, dpm

## 2012-10-13 NOTE — H&P (View-Only) (Signed)
[facility] [place] HISTORY AND PHYSICAL   History of Present Illness:  Patient is a 67 y.o. year old female who presents for evaluation of right leg pain.  She develops pain in the calf in her right leg that occurs after walking 50-60 feet. This is been present for approximately one year. The pain is relieved for 5 minutes of rest. She has no symptoms in the left leg. She has had no prior problems with nonhealing wounds or ulcers on the feet. She denies rest pain. She currently is smoking one half pack per day of cigarettes. Greater than 3 minutes they were spent regarding smoking cessation counseling.  Other medical problems include hyperlipidemia, hypertension, coronary disease, prior history of stroke with residual left arm weakness (no significant stenosis on carotid duplex 2013). These are all currently stable.  Past Medical History  Diagnosis Date  . GERD (gastroesophageal reflux disease)   . Hyperlipidemia   . Hypertension   . Heart attack   . Coronary artery disease 09/09/2011    stress - non-gated; nod/severe perfusion defect in basal/mid inferior, basa/mid inferolateral and apical lateral regions consistient w. scar/infarct; 2009 stress test EF 53% mild/mod perfusion defect  . Shortness of breath   . Stroke     many years ago, "light stroke"  . Weakness of left leg 01/20/2004    left arm/leg both weak, normal echo, no signs emboli  . Cerebral atherosclerosis 02/19/2010    doppler -L ICA mid 0-49% reduction  . Bradycardia, drug induced     resolved w/ d/c bystolic  . Gall bladder disease   . PAF (paroxysmal atrial fibrillation)     post operative in 2009, no recurrences  . COPD (chronic obstructive pulmonary disease)     Past Surgical History  Procedure Laterality Date  . Abdominal hysterectomy  1978, 1982    partial, then complete  . Coronary artery bypass graft  01/2008    x4; LIMA to LAD, SVG to diagonal, SVG to circumflex marginal, SVG to RCA with EVH  of R lower extremity   . Cholecystectomy  10/14/2011    Procedure: LAPAROSCOPIC CHOLECYSTECTOMY WITH INTRAOPERATIVE CHOLANGIOGRAM;  Surgeon: Paul S Toth III, MD;  Location: [facility] OR;  Service: General;  Laterality: N/A;  . Tee without cardioversion  02/07/1999    used intraoperatively w/ CABG to assess LV function,   . Cardiac catheterization  12/15/20091    severe 3 vessel disease, needing CABG, EF 40-45%   left subclavian stent by Dr. Weintraub in 1999.   Social History History  Substance Use Topics  . Smoking status: Current Every Day Smoker -- 0.50 packs/day    Types: Cigarettes  . Smokeless tobacco: Never Used  . Alcohol Use: No     Comment: occasional    Family History Family History  Problem Relation Age of Onset  . Heart failure Mother   . Heart disease Mother   . Hyperlipidemia Mother   . Cancer Father     bone    Allergies  Allergies  Allergen Reactions  . Beta Adrenergic Blockers Other (See Comments)    Significant bradycardia  . Codeine Nausea Only    dizziness  . Statins Other (See Comments)    Muscle aches w/ statins  . Zetia [Ezetimibe] Other (See Comments)    Muscle aches     Current Outpatient Prescriptions  Medication Sig Dispense Refill  . amLODipine (NORVASC) 5 MG tablet Take 5 mg by mouth daily.       .   aspirin 81 MG tablet Take 81 mg by mouth daily.      . cetirizine (ZYRTEC) 10 MG tablet Take 10 mg by mouth daily.      . citalopram (CELEXA) 40 MG tablet Take 40 mg by mouth daily.       . LORazepam (ATIVAN) 0.5 MG tablet Take 0.5 mg by mouth every 8 (eight) hours as needed. For pain      . MICARDIS HCT 40-12.5 MG per tablet Take 1 tablet by mouth daily.       . Omega-3 Fatty Acids (FISH OIL) 1000 MG CAPS Take 2 capsules by mouth daily.      . omeprazole (PRILOSEC) 20 MG capsule Take 20 mg by mouth daily.       . ZETIA 10 MG tablet Take 1 tablet by mouth daily.      . furosemide (LASIX) 20 MG tablet Take 20 mg by mouth as needed.       No  current facility-administered medications for this visit.    ROS:   General:  No weight loss, Fever, chills  HEENT: No recent headaches, no nasal bleeding, no visual changes, no sore throat  Neurologic: No dizziness, blackouts, seizures. No recent symptoms of stroke or mini- stroke. No recent episodes of slurred speech, or temporary blindness.  Cardiac: No recent episodes of chest pain/pressure, no shortness of breath at rest.  + shortness of breath with exertion.  Denies history of atrial fibrillation or irregular heartbeat  Vascular: No history of rest pain in feet.  No history of claudication.  No history of non-healing ulcer, No history of DVT   Pulmonary: No home oxygen, no productive cough, no hemoptysis,  + wheezing  Musculoskeletal:  [ ] Arthritis, [ ] Low back pain,  [ ] Joint pain  Hematologic:No history of hypercoagulable state.  No history of easy bleeding.  No history of anemia  Gastrointestinal: No hematochezia or melena,  No gastroesophageal reflux, no trouble swallowing  Urinary: [ ] chronic Kidney disease, [ ] on HD - [ ] MWF or [ ] TTHS, [ ] Burning with urination, [ ] Frequent urination, [ ] Difficulty urinating;   Skin: No rashes  Psychological: No history of anxiety,  No history of depression   Physical Examination  Filed Vitals:   10/05/12 0926  BP: 124/55  Pulse: 50  Height: 5' 4" (1.626 m)  Weight: 160 lb 8 oz (72.802 kg)  SpO2: 100%    Body mass index is 27.54 kg/(m^2).  General:  Alert and oriented, no acute distress HEENT: Normal Neck: No bruit or JVD Pulmonary: Clear to auscultation bilaterally Cardiac: Regular Rate and Rhythm without murmur Abdomen: Soft, non-tender, non-distended, no mass Skin: No rash Extremity Pulses:  2+ radial, brachial right side, one plus brachial and radial pulse left side, right arm pressure 162/93, left arm pressure 143/55 , 2+ femoral, absent dorsalis pedis, posterior tibial pulses bilaterally Musculoskeletal:  No deformity or edema  Neurologic: Upper and lower extremity motor 5/5 and symmetric  DATA: The patient had bilateral ABIs performed today which were 0.62 on the right 0.94 on the left the patient also had an arterial duplex scan of the right leg which showed a mid superficial femoral artery stenosis. I reviewed and interpreted this study.   ASSESSMENT: Right lower extremity claudication.  PLAN:  I discussed with the patient today smoking cessation and a walking program. I also discussed with her the possibility of arteriogram and possible intervention with potentially SFA stenting. I   discussed with her the risks and benefits of an intervention as well as the benefits of a conservative program of walking and smoking cessation. Currently she has opted for conservative management with a walking program and smoking cessation. She will call me back if she wishes to change this plan to proceed with arteriogram.  Anias Bartol, MD [facility] [place] Office: [phone] Pager: [phone]   

## 2012-10-13 NOTE — Interval H&P Note (Signed)
History and Physical Interval Note:  10/13/2012 8:25 AM  Denise [name]  has presented today for surgery, with the diagnosis of pvd  The various methods of treatment have been discussed with the patient and family. After consideration of risks, benefits and other options for treatment, the patient has consented to  Procedure(s): ABDOMINAL ANGIOGRAM (N/A) as a surgical intervention .  The patient's history has been reviewed, patient examined, no change in status, stable for surgery.  I have reviewed the patient's chart and labs.  Questions were answered to the patient's satisfaction.     FIELDS,CHARLES E

## 2012-10-13 NOTE — Progress Notes (Signed)
Assumed care of pt from Renee Richards, RN. Assessment documented. 

## 2012-10-13 NOTE — Op Note (Signed)
Procedure: Aortogram with bilateral lower extremity runoff  Preoperative diagnosis: Claudication right leg  Postoperative diagnosis: Same  Anesthesia: Local with IV sedation  Operative findings: #1  5 French sheath left common femoral artery                                 #2 high femoral bifurcation                                 #3 right superficial femoral artery occlusion with reconstitution of the above-knee popliteal and three-vessel runoff to the right foot lesion length 20 cm SFA diameter 4 mm or less                                 #4 left posterior tibial artery diffusely diseased patent superficial femoral popliteal and tibial vessels  Operative details: After obtaining informed consent, the patient was taken to the PV lab. The patient was placed in supine position the Angio table. Both groins were prepped and draped in usual sterile fashion. Ultrasound was used to identify the left common femoral artery. The patient had a fairly high femoral bifurcation. Local anesthesia was infiltrated over the left common femoral artery. An introducer needle was used to cannulate the left common femoral artery and an 035 of Versed or wire threaded into the abdominal aorta under fluoroscopic guidance. Next a 5 French sheath was placed over the guidewire and the left common femoral artery. This was thoroughly flushed with heparinized saline. A 5 French pigtail catheter was placed over the guidewire and the abdominal aorta. Abdominal aortogram was obtained. The left and right renal arteries are patent. The infrarenal abdominal aorta is patent. The left and right external and internal and common iliac arteries are patent. The pigtail catheter was pulled down just above the aortic bifurcation and a pelvic angiogram was obtained to confirm the above findings. Next bilateral extremity runoff views were obtained. In the right lower extremity, the right common femoral artery is patent. The right profunda femoris  artery is patent. The right superficial femoral artery is diffusely narrowed from its origin all the way to the above-knee popliteal artery. There are several areas of subtotal occlusion. This lesion measures approximately 20 cm in length. The above-knee popliteal artery is patent. There is three-vessel runoff to the right foot. The vessels overall are small.  In the left lower extremity left common femoral artery is patent. The left profunda femoris and superficial femoral arteries are patent. The popliteal artery is patent. The anterior tibial and peroneal arteries are patent with good runoff to the foot. The posterior tibial artery is small and diffusely diseased with subtotal occlusion.  At this point the 5 French pigtail catheter was removed over guidewire. The 5 French sheath was removed and hemostasis obtained with direct pressure. The patient tolerated the procedure well and there were no complications. The patient was taken to the holding area in stable condition.  Management: The patient will continue working on smoking cessation. She will try to walk 30 minutes daily. She has a trip coming up sooner and wishes to defer consideration for right femoral popliteal bypass until after her trip. I will arrange for her to see me in followup in November.  Fabienne Bruns, MD [facility] [place] Office:  [phone] Pager: [phone]

## 2012-10-25 ENCOUNTER — Telehealth: Payer: Self-pay | Admitting: Vascular Surgery

## 2012-10-25 NOTE — Telephone Encounter (Addendum)
Message copied by Rosalyn Charters on Wed Oct 25, 2012  4:33 PM ------      Message from: Lamar Blinks S      Created: Wed Oct 25, 2012  2:37 PM      Regarding: requests earlier appt.      Contact: [phone]       This patient has called to request earlier appt. than November.  Can we move her ABI's and CEF appt. to 12/07/12?  (Dr. Darrick Penna sent message to schedule in early November, after a trip she planned,  but pt. is nervous about waiting)  notified patient of change in fu appt.  to 12-07-12 3pm ------

## 2012-12-06 ENCOUNTER — Encounter: Payer: Self-pay | Admitting: Vascular Surgery

## 2012-12-07 ENCOUNTER — Ambulatory Visit (HOSPITAL_COMMUNITY)
Admission: RE | Admit: 2012-12-07 | Discharge: 2012-12-07 | Disposition: A | Discharge status: Home / Self Care | Payer: Medicare Other | Source: Ambulatory Visit | Attending: Vascular Surgery | Admitting: Vascular Surgery

## 2012-12-07 ENCOUNTER — Ambulatory Visit (INDEPENDENT_AMBULATORY_CARE_PROVIDER_SITE_OTHER): Payer: Medicare Other | Admitting: Vascular Surgery

## 2012-12-07 ENCOUNTER — Encounter: Payer: Self-pay | Admitting: Vascular Surgery

## 2012-12-07 VITALS — BP 109/69 | HR 69 | Ht 64.0 in | Wt 156.7 lb

## 2012-12-07 DIAGNOSIS — I739 Peripheral vascular disease, unspecified: Secondary | ICD-10-CM

## 2012-12-07 MED ORDER — VARENICLINE TARTRATE 0.5 MG PO TABS: 0.5000 mg | ORAL_TABLET | Freq: Two times a day (BID) | ORAL | Status: DC

## 2012-12-07 NOTE — Progress Notes (Signed)
[facility] [place] HISTORY AND PHYSICAL    History of Present Illness:  Patient is a 67 y.o. year old female who presents for evaluation of right leg pain.  She develops pain in the calf in her right leg that occurs after walking 50-60 feet. This has been present for approximately one year. The pain is relieved for 5 minutes of rest. She has no symptoms in the left leg. She has had no prior problems with nonhealing wounds or ulcers on the feet. She denies rest pain. She currently is smoking one half pack per day of cigarettes. Greater than 3 minutes they were spent regarding smoking cessation counseling.  She has taken Chantix in the past with no complication. Other medical problems include hyperlipidemia, hypertension, coronary disease, prior history of stroke with residual left arm weakness (no significant stenosis on carotid duplex 2013). These are all currently stable. She recently had an arteriogram which showed a lengthy right superficial femoral artery occlusion with reconstitution of the above-knee popliteal artery and three-vessel runoff to the right foot. In the left lower extremity she had a posterior tibial artery occlusion but otherwise all vessels were patent in the left lower extremity.    Past Medical History   Diagnosis  Date   .  GERD (gastroesophageal reflux disease)     .  Hyperlipidemia     .  Hypertension     .  Heart attack     .  Coronary artery disease  09/09/2011       stress - non-gated; nod/severe perfusion defect in basal/mid inferior, basa/mid inferolateral and apical lateral regions consistient w. scar/infarct; 2009 stress test EF 53% mild/mod perfusion defect   .  Shortness of breath     .  Stroke         many years ago, "light stroke"   .  Weakness of left leg  01/20/2004       left arm/leg both weak, normal echo, no signs emboli   .  Cerebral atherosclerosis  02/19/2010       doppler -L ICA mid 0-49% reduction   .  Bradycardia, drug induced          resolved w/ d/c bystolic   .  Gall bladder disease     .  PAF (paroxysmal atrial fibrillation)         post operative in 2009, no recurrences   .  COPD (chronic obstructive pulmonary disease)         Past Surgical History   Procedure  Laterality  Date   .  Abdominal hysterectomy    1978, 1982       partial, then complete   .  Coronary artery bypass graft    01/2008       x4; LIMA to LAD, SVG to diagonal, SVG to circumflex marginal, SVG to RCA with EVH of R lower extremity    .  Cholecystectomy    10/14/2011       Procedure: LAPAROSCOPIC CHOLECYSTECTOMY WITH INTRAOPERATIVE CHOLANGIOGRAM;  Surgeon: Robyne Askew, MD;  Location: [facility] OR;  Service: General;  Laterality: N/A;   .  Tee without cardioversion    02/07/1999       used intraoperatively w/ CABG to assess LV function,    .  Cardiac catheterization    12/15/20091       severe 3 vessel disease, needing CABG, EF 40-45%    left subclavian stent by Dr. Alanda Amass in 1999.  Social History History   Substance Use Topics   .  Smoking status:  Current Every Day Smoker -- 0.50 packs/day       Types:  Cigarettes   .  Smokeless tobacco:  Never Used   .  Alcohol Use:  No         Comment: occasional     Family History Family History   Problem  Relation  Age of Onset   .  Heart failure  Mother     .  Heart disease  Mother     .  Hyperlipidemia  Mother     .  Cancer  Father         bone     Allergies    Allergies   Allergen  Reactions   .  Beta Adrenergic Blockers  Other (See Comments)       Significant bradycardia   .  Codeine  Nausea Only       dizziness   .  Statins  Other (See Comments)       Muscle aches w/ statins   .  Zetia [Ezetimibe]  Other (See Comments)       Muscle aches        Current Outpatient Prescriptions   Medication  Sig  Dispense  Refill   .  amLODipine (NORVASC) 5 MG tablet  Take 5 mg by mouth daily.          Marland Kitchen  aspirin 81 MG tablet  Take 81 mg by mouth daily.         .  cetirizine (ZYRTEC)  10 MG tablet  Take 10 mg by mouth daily.         .  citalopram (CELEXA) 40 MG tablet  Take 40 mg by mouth daily.          Marland Kitchen  LORazepam (ATIVAN) 0.5 MG tablet  Take 0.5 mg by mouth every 8 (eight) hours as needed. For pain         .  MICARDIS HCT 40-12.5 MG per tablet  Take 1 tablet by mouth daily.          .  Omega-3 Fatty Acids (FISH OIL) 1000 MG CAPS  Take 2 capsules by mouth daily.         Marland Kitchen  omeprazole (PRILOSEC) 20 MG capsule  Take 20 mg by mouth daily.          Marland Kitchen  ZETIA 10 MG tablet  Take 1 tablet by mouth daily.         .  furosemide (LASIX) 20 MG tablet  Take 20 mg by mouth as needed.            No current facility-administered medications for this visit.     ROS:    Cardiac: No recent episodes of chest pain/pressure, no shortness of breath at rest.  + shortness of breath with exertion.  Denies history of atrial fibrillation or irregular heartbeat  Vascular: No history of rest pain in feet.  + history of claudication.  No history of non-healing ulcer, No history of DVT    Pulmonary: No home oxygen, no productive cough, no hemoptysis,  + wheezing  Psychological: No history of major depression   Physical Examination     Filed Vitals:   12/07/12 1543  BP: 109/69  Pulse: 69  Height: 5\' 4"  (1.626 m)  Weight: 156 lb 11.2 oz (71.079 kg)  SpO2: 100%     General:  Alert and oriented,  no acute distress HEENT: Normal Skin: No rash, no ulcer Extremity Pulses:   2+ femoral, bilaterally left 2+ dorsalis pedis pulse absent dorsalis pedis, posterior tibial pulses right Musculoskeletal: No deformity or edema     Neurologic: Upper and lower extremity motor 5/5 and symmetric  DATA: The patient had prior which were 0.62 on the right 0.94 on the left the patient also had an arterial duplex scan of the right leg which showed a mid superficial femoral artery stenosis. I reviewed and interpreted this study.   ASSESSMENT: Right lower extremity claudication.  PLAN:  I discussed with the  patient today smoking cessation and a walking program. I also discussed with her the possibility of right femoral to above-knee popliteal bypass. I do not believe she is a candidate for a right superficial femoral artery stent due to the length of the occlusion as well as small vessel diameter. I discussed with her the risks and benefits of a bypass as well as the benefits of a conservative program of walking and smoking cessation. Currently she has opted for conservative management with a walking program and smoking cessation. She will call me back if she wishes to pursue a bypass. I have given her a prescription for Chantix today. She will followup with repeat arterial duplex and ABIs in 6 months. Discussed with patient today that her lifetime risk of limb loss is less than 5% if she is able to quit smoking.  Fabienne Bruns, MD [facility] [place] Office: [phone] Pager: [phone]

## 2012-12-21 ENCOUNTER — Ambulatory Visit: Payer: Medicare Other | Admitting: Vascular Surgery

## 2012-12-21 ENCOUNTER — Other Ambulatory Visit: Payer: Self-pay

## 2012-12-28 ENCOUNTER — Encounter: Payer: Self-pay | Admitting: *Deleted

## 2012-12-28 ENCOUNTER — Telehealth: Payer: Self-pay | Admitting: Vascular Surgery

## 2012-12-28 ENCOUNTER — Other Ambulatory Visit: Payer: Self-pay | Admitting: *Deleted

## 2012-12-28 DIAGNOSIS — Z01810 Encounter for preprocedural cardiovascular examination: Secondary | ICD-10-CM

## 2012-12-28 DIAGNOSIS — I739 Peripheral vascular disease, unspecified: Secondary | ICD-10-CM

## 2012-12-28 NOTE — Telephone Encounter (Addendum)
Message copied by Fredrich Birks on Thu Dec 28, 2012  4:24 PM ------      Message from: [name], [place] K      Created: Thu Dec 28, 2012  3:54 PM      Regarding: schedule       I       put in order for cardiology       ----- Message -----         From: Melene Plan, RN         Sent: 12/28/2012   3:42 PM           To: Sharee Pimple, CMA, [facility] [place]            DR FIELDS NEEDS TO DO A RIGHT FEM-POP BPG. SHE NEEDS CARDIAC CLEARANCE. SHE SAW AL LITTLE IN THE PAST. WHEN HE RETIRED SHE SAW DR HILTY & TOLD HIM SHE WOULD NOT SEE HIM AGAIN.       THANKS      JUDY       ------  12/28/12: LM for pt to call for appt information: She will see Dr Delton See at [facility] (former [place] office) [address] [place]. On Monday 01/08/2013 @ 11:00am.

## 2013-01-01 ENCOUNTER — Encounter (HOSPITAL_COMMUNITY): Payer: Self-pay | Admitting: Pharmacy Technician

## 2013-01-04 ENCOUNTER — Encounter (HOSPITAL_COMMUNITY)
Admission: RE | Admit: 2013-01-04 | Discharge: 2013-01-04 | Disposition: A | Discharge status: Home / Self Care | Payer: Medicare Other | Source: Ambulatory Visit | Attending: Vascular Surgery | Admitting: Vascular Surgery

## 2013-01-04 ENCOUNTER — Ambulatory Visit (HOSPITAL_COMMUNITY)
Admission: RE | Admit: 2013-01-04 | Discharge: 2013-01-04 | Disposition: A | Discharge status: Home / Self Care | Payer: Medicare Other | Source: Ambulatory Visit | Attending: Anesthesiology | Admitting: Anesthesiology

## 2013-01-04 ENCOUNTER — Encounter (HOSPITAL_COMMUNITY): Payer: Self-pay

## 2013-01-04 DIAGNOSIS — F17200 Nicotine dependence, unspecified, uncomplicated: Secondary | ICD-10-CM | POA: Insufficient documentation

## 2013-01-04 DIAGNOSIS — J4489 Other specified chronic obstructive pulmonary disease: Secondary | ICD-10-CM | POA: Insufficient documentation

## 2013-01-04 DIAGNOSIS — E785 Hyperlipidemia, unspecified: Secondary | ICD-10-CM | POA: Insufficient documentation

## 2013-01-04 DIAGNOSIS — K219 Gastro-esophageal reflux disease without esophagitis: Secondary | ICD-10-CM | POA: Insufficient documentation

## 2013-01-04 DIAGNOSIS — I10 Essential (primary) hypertension: Secondary | ICD-10-CM | POA: Insufficient documentation

## 2013-01-04 DIAGNOSIS — Z951 Presence of aortocoronary bypass graft: Secondary | ICD-10-CM | POA: Insufficient documentation

## 2013-01-04 DIAGNOSIS — I2510 Atherosclerotic heart disease of native coronary artery without angina pectoris: Secondary | ICD-10-CM | POA: Insufficient documentation

## 2013-01-04 DIAGNOSIS — Z9861 Coronary angioplasty status: Secondary | ICD-10-CM | POA: Insufficient documentation

## 2013-01-04 DIAGNOSIS — R0602 Shortness of breath: Secondary | ICD-10-CM | POA: Insufficient documentation

## 2013-01-04 DIAGNOSIS — J449 Chronic obstructive pulmonary disease, unspecified: Secondary | ICD-10-CM | POA: Insufficient documentation

## 2013-01-04 DIAGNOSIS — Z01818 Encounter for other preprocedural examination: Secondary | ICD-10-CM | POA: Insufficient documentation

## 2013-01-04 LAB — URINE MICROSCOPIC-ADD ON

## 2013-01-04 LAB — COMPREHENSIVE METABOLIC PANEL
Albumin: 3.3 g/dL — ABNORMAL LOW (ref 3.5–5.2)
BUN: 10 mg/dL (ref 6–23)
Chloride: 105 mEq/L (ref 96–112)
Creatinine, Ser: 0.63 mg/dL (ref 0.50–1.10)
Total Bilirubin: 0.3 mg/dL (ref 0.3–1.2)
Total Protein: 7.1 g/dL (ref 6.0–8.3)

## 2013-01-04 LAB — CBC
HCT: 42.2 % (ref 36.0–46.0)
MCHC: 33.2 g/dL (ref 30.0–36.0)
MCV: 95 fL (ref 78.0–100.0)
RDW: 13.6 % (ref 11.5–15.5)

## 2013-01-04 LAB — URINALYSIS, ROUTINE W REFLEX MICROSCOPIC
Bilirubin Urine: NEGATIVE
Hgb urine dipstick: NEGATIVE
Ketones, ur: NEGATIVE mg/dL
Nitrite: NEGATIVE
pH: 6 (ref 5.0–8.0)

## 2013-01-04 LAB — SURGICAL PCR SCREEN: Staphylococcus aureus: NEGATIVE

## 2013-01-04 LAB — PROTIME-INR
INR: 0.92 (ref 0.00–1.49)
Prothrombin Time: 12.2 seconds (ref 11.6–15.2)

## 2013-01-04 LAB — TYPE AND SCREEN

## 2013-01-04 NOTE — Pre-Procedure Instructions (Signed)
Denise [name]  01/04/2013   Your procedure is scheduled on:  Monday, January 15, 2013  Report to [facility] Entrance "A" [address] at [employer] AM.  Call this number if you have problems the morning of surgery: [phone]   Remember:   Do not eat food or drink liquids after midnight.   Take these medicines the morning of surgery with A SIP OF WATER: amLODipine (NORVASC) 5 MG tablet, cetirizine (ZYRTEC) 10 MG tablet, citalopram (CELEXA) 40 MG tablet, omeprazole (PRILOSEC) 20 MG capsule,               Stop taking Fish Oil November 24   STOP Aleve, Naproxen, Advil, Ibuprofen, Vitamin, Herbs, or Supplements starting November 24  Do not wear jewelry, make-up or nail polish.  Do not wear lotions, powders, or perfumes. You may wear deodorant.  Do not shave 48 hours prior to surgery.   Do not bring valuables to the hospital.  [facility] is not responsible for any belongings or valuables.               Contacts, dentures or bridgework may not be worn into surgery.  Leave suitcase in the car. After surgery it may be brought to your room.  For patients admitted to the hospital, discharge time is determined by your treatment team.               Patients discharged the day of surgery will not be allowed to drive home.  Name and phone number of your driver:   Special Instructions: Shower using CHG 2 nights before surgery and the night before surgery.  If you shower the day of surgery use CHG.  Use special wash - you have one bottle of CHG for all showers.  You should use approximately 1/3 of the bottle for each shower.   Please read over the following fact sheets that you were given: Pain Booklet, Coughing and Deep Breathing, Blood Transfusion Information, MRSA Information and Surgical Site Infection Prevention

## 2013-01-05 ENCOUNTER — Encounter (HOSPITAL_COMMUNITY): Payer: Self-pay | Admitting: Vascular Surgery

## 2013-01-05 NOTE — Progress Notes (Addendum)
Anesthesia Chart Review:  Patient is scheduled for right FPBG on 01/15/13 by Dr. Darrick Penna.  History includes smoking, hyperlipidemia, hypertension, CVA, GERD, COPD, shortness of breath, PAF, CAD/MI s/p RCA stent ''98, LAD stents X 2 '04 and CABG (LIMA to LAD, SVG to DIAG, SVG to CX, SVG to RCA) '09, post-CABG PAF, PAD, left [place] artery stent, cholecystectomy '13. PCP is listed as Dr. Elfredia Nevins.   Former Cardiologist was Dr. Caprice Kluver.  She is scheduled to get established with Dr. Tobias Alexander on 01/08/13.   She had a nuclear stress test done on 09/10/11 that showed moderate to severe perfusion defect in the basal inferior, midinferior, basal, inferior, lateral, midinferior lateral and apical lateral legions consistent with infarct/scar. Study was non-gated secondary to ectopy. Compared to the previous study, the defect distribution was felt to be identical, but the defect was now severe and fixed. Overall, it was felt to be a low risk.    EKG on 10/13/12 showed SB @ 55 bpm, possible inferior infarct (age undetermined), nonspecific ST abnormality.    Intra-operative (CABG) TEE on 02/07/08 showed dynamic LV function with LV inferior wall moderate hypokinseis.   Pre-CABG cath on 01/30/08 showed severe 3V CAD, EF 40-45%.   Carotid duplex on 08/28/12 showed: 1. Bilateral carotid bifurcation and proximal ICA plaque, resulting in less than 50% diameter stenosis. The exam does not exclude plaque ulceration or embolization. Continued surveillance recommended.  2. Origin stenosis of the left external carotid artery, of questionable clinical significance.  3. Reversal of systolic flow direction in the left vertebral  artery suggesting proximal occlusive disease. Correlate with any clinical evidence of subclavian steal.  CXR on 01/04/13 showed no active cardiopulmonary disease.  Preoperative labs noted.  I'll follow-up cardiology records once available.  Velna [facility] Short Stay  Center/Anesthesiology Phone [phone] 01/05/2013 12:39 PM  Addendum: 01/08/2013 12:50 PM Patient was cleared by cardiologist Dr. Delton See at today's visit.

## 2013-01-08 ENCOUNTER — Ambulatory Visit (INDEPENDENT_AMBULATORY_CARE_PROVIDER_SITE_OTHER): Payer: Medicare Other | Admitting: Cardiology

## 2013-01-08 ENCOUNTER — Encounter: Payer: Self-pay | Admitting: Cardiology

## 2013-01-08 VITALS — BP 100/66 | HR 69 | Ht 63.0 in | Wt 159.0 lb

## 2013-01-08 DIAGNOSIS — I10 Essential (primary) hypertension: Secondary | ICD-10-CM | POA: Insufficient documentation

## 2013-01-08 DIAGNOSIS — I70219 Atherosclerosis of native arteries of extremities with intermittent claudication, unspecified extremity: Secondary | ICD-10-CM

## 2013-01-08 DIAGNOSIS — E785 Hyperlipidemia, unspecified: Secondary | ICD-10-CM

## 2013-01-08 NOTE — Progress Notes (Signed)
Patient ID: Denise [name], female   DOB: 07-Sep-1945, 67 y.o.   MRN: [id number]     Patient Name: Denise [name] Date of Encounter: 01/08/2013  Primary Care Provider:  Cassell Smiles., MD Primary Cardiologist:  Tobias Alexander, H   Problem List   Past Medical History  Diagnosis Date  . GERD (gastroesophageal reflux disease)   . Hyperlipidemia   . Hypertension   . Coronary artery disease 09/09/2011    stress - non-gated; nod/severe perfusion defect in basal/mid inferior, basa/mid inferolateral and apical lateral regions consistient w. scar/infarct; 2009 stress test EF 53% mild/mod perfusion defect  . Shortness of breath   . Stroke     many years ago, "light stroke"  . Weakness of left leg 01/20/2004    left arm/leg both weak, normal echo, no signs emboli  . Cerebral atherosclerosis 02/19/2010    doppler -L ICA mid 0-49% reduction  . Bradycardia, drug induced     resolved w/ d/c bystolic  . Gall bladder disease   . PAF (paroxysmal atrial fibrillation)     post operative in 2009, no recurrences  . COPD (chronic obstructive pulmonary disease)   . Heart attack ~ 17 years ago   Past Surgical History  Procedure Laterality Date  . Abdominal hysterectomy  1978, 1982    partial, then complete  . Coronary artery bypass graft  01/2008    x4; LIMA to LAD, SVG to diagonal, SVG to circumflex marginal, SVG to RCA with EVH of R lower extremity   . Cholecystectomy  10/14/2011    Procedure: LAPAROSCOPIC CHOLECYSTECTOMY WITH INTRAOPERATIVE CHOLANGIOGRAM;  Surgeon: Robyne Askew, MD;  Location: [facility] OR;  Service: General;  Laterality: N/A;  . Tee without cardioversion  02/07/2008    used intraoperatively w/ CABG to assess LV function,   . Cardiac catheterization  01/30/2008    severe 3 vessel disease, needing CABG, EF 40-45%    Allergies  Allergies  Allergen Reactions  . Beta Adrenergic Blockers Other (See Comments)    Significant bradycardia  . Codeine Nausea Only    dizziness  .  Statins Other (See Comments)    Muscle aches w/ statins    HPI  67 year old female with PMH of ongoing smoking, hyperlipidemia, hypertension, CVA, GERD, COPD, CAD/MI s/p RCA stent in 1998, LAD stents x 2 in 2004 and CABG (LIMA to LAD, SVG to DIAG, SVG to CX, SVG to RCA) 09, post-CABG PAF, PAD, who has been diagnosed with PAD (right superficial artery occlusion, s/p failed stenting) and is scheduled for femoro-popliteal bypass. Prior to her CABG her LV EF was 40-35%.  Based on patient's report she had a nuclear stress test in 2013 that was negative for ischemia.  The patient states that since her CABG she hasn't experienced chest pain or shorrtness of breath. She is currently limited by right lower leg claudications after walking 50 feet. She doesn't experience DOE, palpitations, dizziness or syncope. She also denies orthopnea, PND, or edema.    Home Medications  Prior to Admission medications   Medication Sig Start Date End Date Taking? Authorizing Provider  amLODipine (NORVASC) 5 MG tablet Take 5 mg by mouth daily.  08/13/11  Yes Historical Provider, MD  aspirin 81 MG tablet Take 81 mg by mouth daily.   Yes Historical Provider, MD  cetirizine (ZYRTEC) 10 MG tablet Take 10 mg by mouth daily.   Yes Historical Provider, MD  citalopram (CELEXA) 40 MG tablet Take 40 mg by mouth daily.  08/06/11  Yes Historical Provider, MD  LORazepam (ATIVAN) 0.5 MG tablet Take 0.5 mg by mouth every 8 (eight) hours as needed for anxiety.  08/18/11  Yes Historical Provider, MD  MICARDIS HCT 40-12.5 MG per tablet Take 1 tablet by mouth daily.  08/20/11  Yes Historical Provider, MD  Omega-3 Fatty Acids (FISH OIL) 1000 MG CAPS Take 1 capsule by mouth 2 (two) times daily.    Yes Historical Provider, MD  omeprazole (PRILOSEC) 20 MG capsule Take 20 mg by mouth daily.  07/28/11  Yes Historical Provider, MD  ZETIA 10 MG tablet Take 1 tablet by mouth 3 (three) times a week. Tuesday, Thursday, Sunday. 12/05/12  Yes Historical  Provider, MD    Family History  Family History  Problem Relation Age of Onset  . Heart failure Mother   . Heart disease Mother   . Hyperlipidemia Mother   . Cancer Father     bone    Social History  History   Social History  . Marital Status: Widowed    Spouse Name: N/A    Number of Children: N/A  . Years of Education: N/A   Occupational History  . Not on file.   Social History Main Topics  . Smoking status: Current Every Day Smoker -- 0.50 packs/day for 50 years    Types: Cigarettes  . Smokeless tobacco: Never Used  . Alcohol Use: Yes     Comment: occasional reports maybe 5 drinks a year  . Drug Use: No  . Sexual Activity: Not on file   Other Topics Concern  . Not on file   Social History Narrative  . No narrative on file     Review of Systems, as per HPI, otherwise negative General:  No chills, fever, night sweats or weight changes.  Cardiovascular:  No chest pain, dyspnea on exertion, edema, orthopnea, palpitations, paroxysmal nocturnal dyspnea. Dermatological: No rash, lesions/masses Respiratory: No cough, dyspnea Urologic: No hematuria, dysuria Abdominal:   No nausea, vomiting, diarrhea, bright red blood per rectum, melena, or hematemesis Neurologic:  No visual changes, wkns, changes in mental status. All other systems reviewed and are otherwise negative except as noted above.  Physical Exam  Blood pressure 100/66, pulse 69, height 5\' 3"  (1.6 m), weight 159 lb (72.122 kg), SpO2 96.00%.  General: Pleasant, NAD Psych: Normal affect. Neuro: Alert and oriented X 3. Moves all extremities spontaneously. HEENT: Normal  Neck: Supple without bruits or JVD. Lungs:  Resp regular and unlabored, CTA. Heart: RRR no s3, s4, or murmurs. Abdomen: Soft, non-tender, non-distended, BS + x 4.  Extremities: No clubbing, cyanosis or edema. DP/PT non palpable on the right, Radials 2+ and equal bilaterally.  Labs:  No results found for this basename: CKTOTAL, CKMB,  TROPONINI,  in the last 72 hours Lab Results  Component Value Date   WBC 7.7 01/04/2013   HGB 14.0 01/04/2013   HCT 42.2 01/04/2013   MCV 95.0 01/04/2013   PLT 237 01/04/2013    Recent Labs Lab 01/04/13 0840  NA 140  K 3.4*  CL 105  CO2 25  BUN 10  CREATININE 0.63  CALCIUM 9.5  PROT 7.1  BILITOT 0.3  ALKPHOS 90  ALT 14  AST 15  GLUCOSE 124*   Accessory Clinical Findings  ECG - sinus rhythm, 62 beats per minute, prior inferior wall myocardial infarction, nonspecific ST-T changes, abnormal EKG   Assessment & Plan  67 year old female with significant CAD history, s/p CABG in 2009 and peripheral  arterial disease who is scheduled for femoro-popliteal bypass.  1. Preoperative risk assessment: The patient doesn't have any active symptoms suggestive of acute coronary syndrome or heart failure. She can certainly achieve at least 4 METS. Her ECG doesn't show any changes suggestive of ischemia.  There is currently no contraindication to her scheduled surgery. Ideally, we would like her to be on betablocker in the peri-operative period, however she is allergic to betablockers (based on patient's report she gets profound bradycardia).  2. CAD - no reason for ischemia testing at this time - continue aspirin, zetia, omega 3 acids, she states that she is allergic to statins  3. Hypertension - controlled on amlodipine  4. Hyperlipidemia - allergic to statins, on zetia and omega 3 acids, considering her h/o CAD, and PAD we will refer her to the lipid clinic  5. B/L Carotid disease - duplex on 08/28/12 showed bilateral carotid bifurcation and proximal ICA plaque, resulting in less than 50% diameter stenosis. Follow up in 1 year  Follow up in 6 months    Lars Masson, MD, [facility] 01/08/2013, 11:32 AM

## 2013-01-08 NOTE — Patient Instructions (Signed)
You have been cleared for surgery  Your physician recommends that you schedule a follow-up appointment in: 6 month  Dr Delton See is referring you to our Lipid clinic, we will arrange.

## 2013-01-12 NOTE — Progress Notes (Signed)
Spoke with Retta Mac and informed her of time change.  Informed her to be here at 0730 and that surgery is 1111. Pt states "I might just cancel it".

## 2013-01-15 ENCOUNTER — Encounter (HOSPITAL_COMMUNITY): Admission: RE | Payer: Self-pay | Source: Ambulatory Visit

## 2013-01-15 ENCOUNTER — Inpatient Hospital Stay (HOSPITAL_COMMUNITY): Admission: RE | Admit: 2013-01-15 | Payer: Medicare Other | Source: Ambulatory Visit | Admitting: Vascular Surgery

## 2013-01-15 SURGERY — BYPASS GRAFT FEMORAL-POPLITEAL ARTERY
Anesthesia: General | Site: Leg Upper | Laterality: Right

## 2013-01-18 ENCOUNTER — Other Ambulatory Visit: Payer: Self-pay

## 2013-01-22 ENCOUNTER — Encounter (HOSPITAL_COMMUNITY)
Admission: RE | Admit: 2013-01-22 | Discharge: 2013-01-22 | Disposition: A | Discharge status: Home / Self Care | Payer: Medicare Other | Source: Ambulatory Visit | Attending: Vascular Surgery | Admitting: Vascular Surgery

## 2013-01-22 ENCOUNTER — Encounter (HOSPITAL_COMMUNITY): Payer: Self-pay

## 2013-01-22 ENCOUNTER — Encounter (HOSPITAL_COMMUNITY): Payer: Self-pay | Admitting: Certified Registered Nurse Anesthetist

## 2013-01-22 ENCOUNTER — Other Ambulatory Visit (HOSPITAL_COMMUNITY): Payer: Self-pay | Admitting: *Deleted

## 2013-01-22 LAB — COMPREHENSIVE METABOLIC PANEL
ALT: 11 U/L (ref 0–35)
Alkaline Phosphatase: 97 U/L (ref 39–117)
CO2: 25 mEq/L (ref 19–32)
Creatinine, Ser: 0.63 mg/dL (ref 0.50–1.10)
GFR calc Af Amer: >90 mL/min (ref >90)
GFR calc non Af Amer: >90 mL/min (ref >90)
Glucose, Bld: 149 mg/dL — ABNORMAL HIGH (ref 70–99)
Potassium: 3.6 mEq/L (ref 3.5–5.1)
Sodium: 134 mEq/L — ABNORMAL LOW (ref 135–145)
Total Bilirubin: 0.3 mg/dL (ref 0.3–1.2)
Total Protein: 7.2 g/dL (ref 6.0–8.3)

## 2013-01-22 LAB — PROTIME-INR
INR: 0.95 (ref 0.00–1.49)
Prothrombin Time: 12.5 seconds (ref 11.6–15.2)

## 2013-01-22 LAB — APTT: aPTT: 27 seconds (ref 24–37)

## 2013-01-22 LAB — URINALYSIS, ROUTINE W REFLEX MICROSCOPIC
Bilirubin Urine: NEGATIVE
Glucose, UA: NEGATIVE mg/dL
Ketones, ur: NEGATIVE mg/dL
Nitrite: NEGATIVE
pH: 5.5 (ref 5.0–8.0)

## 2013-01-22 LAB — CBC
HCT: 43.7 % (ref 36.0–46.0)
Hemoglobin: 14.2 g/dL (ref 12.0–15.0)
MCH: 31.1 pg (ref 26.0–34.0)
MCHC: 32.5 g/dL (ref 30.0–36.0)
MCV: 95.6 fL (ref 78.0–100.0)
RBC: 4.57 MIL/uL (ref 3.87–5.11)

## 2013-01-22 MED ORDER — DEXTROSE 5 % IV SOLN
1.5000 g | INTRAVENOUS | Status: AC
  Administered 2013-01-23: 1.5 g via INTRAVENOUS
  Filled 2013-01-22: qty 1.5

## 2013-01-22 NOTE — Pre-Procedure Instructions (Signed)
Denise [name]  01/22/2013   Your procedure is scheduled on:  Tuesday, January 23, 2013 at 7:30 AM.   Report to [facility] Entrance "A" Admitting Office at 5:30 AM.   Call this number if you have problems the morning of surgery: [phone]   Remember:   Do not eat food or drink liquids after midnight tonight, 01/22/13.   Take these medicines the morning of surgery with A SIP OF WATER: amLODipine (NORVASC), cetirizine (ZYRTEC), citalopram (CELEXA), omeprazole (PRILOSEC), LORazepam (ATIVAN) - if needed.                Do not wear jewelry, make-up or nail polish.  Do not wear lotions, powders, or perfumes. You may wear deodorant.  Do not shave 48 hours prior to surgery.  Do not bring valuables to the hospital.  [facility] is not responsible                  for any belongings or valuables.               Contacts, dentures or bridgework may not be worn into surgery.  Leave suitcase in the car. After surgery it may be brought to your room.  For patients admitted to the hospital, discharge time is determined by your                treatment team.            Special Instructions: Shower using CHG 2 nights before surgery and the night before surgery.  If you shower the day of surgery use CHG.  Use special wash - you have one bottle of CHG for all showers.  You should use approximately 1/3 of the bottle for each shower.   Please read over the following fact sheets that you were given: Pain Booklet, Coughing and Deep Breathing, Blood Transfusion Information, MRSA Information and Surgical Site Infection Prevention

## 2013-01-23 ENCOUNTER — Inpatient Hospital Stay (HOSPITAL_COMMUNITY)
Admission: RE | Admit: 2013-01-23 | Discharge: 2013-01-25 | DRG: 253 | Disposition: A | Discharge status: Home / Self Care | Payer: Medicare Other | Source: Ambulatory Visit | Attending: Vascular Surgery | Admitting: Vascular Surgery

## 2013-01-23 ENCOUNTER — Encounter (HOSPITAL_COMMUNITY): Payer: Self-pay | Admitting: *Deleted

## 2013-01-23 ENCOUNTER — Inpatient Hospital Stay (HOSPITAL_COMMUNITY): Payer: Medicare Other | Admitting: Certified Registered Nurse Anesthetist

## 2013-01-23 ENCOUNTER — Encounter (HOSPITAL_COMMUNITY)
Admission: RE | Disposition: A | Discharge status: Home / Self Care | Payer: Self-pay | Source: Ambulatory Visit | Attending: Vascular Surgery

## 2013-01-23 ENCOUNTER — Encounter (HOSPITAL_COMMUNITY): Payer: Medicare Other | Admitting: Certified Registered Nurse Anesthetist

## 2013-01-23 DIAGNOSIS — F17200 Nicotine dependence, unspecified, uncomplicated: Secondary | ICD-10-CM | POA: Diagnosis present

## 2013-01-23 DIAGNOSIS — Z951 Presence of aortocoronary bypass graft: Secondary | ICD-10-CM

## 2013-01-23 DIAGNOSIS — J449 Chronic obstructive pulmonary disease, unspecified: Secondary | ICD-10-CM | POA: Diagnosis present

## 2013-01-23 DIAGNOSIS — E785 Hyperlipidemia, unspecified: Secondary | ICD-10-CM

## 2013-01-23 DIAGNOSIS — I70219 Atherosclerosis of native arteries of extremities with intermittent claudication, unspecified extremity: Principal | ICD-10-CM

## 2013-01-23 DIAGNOSIS — K219 Gastro-esophageal reflux disease without esophagitis: Secondary | ICD-10-CM | POA: Diagnosis present

## 2013-01-23 DIAGNOSIS — J4489 Other specified chronic obstructive pulmonary disease: Secondary | ICD-10-CM | POA: Diagnosis present

## 2013-01-23 DIAGNOSIS — Z7982 Long term (current) use of aspirin: Secondary | ICD-10-CM

## 2013-01-23 DIAGNOSIS — R29898 Other symptoms and signs involving the musculoskeletal system: Secondary | ICD-10-CM | POA: Diagnosis present

## 2013-01-23 DIAGNOSIS — I672 Cerebral atherosclerosis: Secondary | ICD-10-CM | POA: Diagnosis present

## 2013-01-23 DIAGNOSIS — I10 Essential (primary) hypertension: Secondary | ICD-10-CM

## 2013-01-23 DIAGNOSIS — I2510 Atherosclerotic heart disease of native coronary artery without angina pectoris: Secondary | ICD-10-CM | POA: Diagnosis present

## 2013-01-23 DIAGNOSIS — I4891 Unspecified atrial fibrillation: Secondary | ICD-10-CM | POA: Diagnosis present

## 2013-01-23 DIAGNOSIS — I69998 Other sequelae following unspecified cerebrovascular disease: Secondary | ICD-10-CM

## 2013-01-23 DIAGNOSIS — I739 Peripheral vascular disease, unspecified: Secondary | ICD-10-CM

## 2013-01-23 DIAGNOSIS — I7092 Chronic total occlusion of artery of the extremities: Secondary | ICD-10-CM | POA: Diagnosis present

## 2013-01-23 DIAGNOSIS — I252 Old myocardial infarction: Secondary | ICD-10-CM

## 2013-01-23 HISTORY — PX: FEMORAL-POPLITEAL BYPASS GRAFT: SHX937

## 2013-01-23 LAB — CBC
Hemoglobin: 11.5 g/dL — ABNORMAL LOW (ref 12.0–15.0)
MCHC: 31.8 g/dL (ref 30.0–36.0)
MCV: 96 fL (ref 78.0–100.0)
Platelets: 190 10*3/uL (ref 150–400)
RBC: 3.77 MIL/uL — ABNORMAL LOW (ref 3.87–5.11)

## 2013-01-23 LAB — CREATININE, SERUM
Creatinine, Ser: 0.63 mg/dL (ref 0.50–1.10)
GFR calc non Af Amer: >90 mL/min (ref >90)

## 2013-01-23 SURGERY — BYPASS GRAFT FEMORAL-POPLITEAL ARTERY
Anesthesia: General | Site: Leg Upper | Laterality: Right

## 2013-01-23 MED ORDER — IRBESARTAN 150 MG PO TABS
150.0000 mg | ORAL_TABLET | Freq: Every day | ORAL | Status: DC
  Administered 2013-01-24: 150 mg via ORAL
  Filled 2013-01-23 (×3): qty 1

## 2013-01-23 MED ORDER — LABETALOL HCL 5 MG/ML IV SOLN
10.0000 mg | INTRAVENOUS | Status: DC | PRN
  Filled 2013-01-23: qty 4

## 2013-01-23 MED ORDER — METOCLOPRAMIDE HCL 5 MG/ML IJ SOLN: 10.0000 mg | Freq: Once | INTRAMUSCULAR | Status: DC | PRN

## 2013-01-23 MED ORDER — ACETAMINOPHEN 325 MG PO TABS
325.0000 mg | ORAL_TABLET | ORAL | Status: DC | PRN
  Administered 2013-01-24: 325 mg via ORAL
  Filled 2013-01-23: qty 2

## 2013-01-23 MED ORDER — TELMISARTAN-HCTZ 40-12.5 MG PO TABS: 1.0000 | ORAL_TABLET | Freq: Every day | ORAL | Status: DC

## 2013-01-23 MED ORDER — FENTANYL CITRATE 0.05 MG/ML IJ SOLN
INTRAMUSCULAR | Status: DC | PRN
  Administered 2013-01-23 (×3): 50 ug via INTRAVENOUS

## 2013-01-23 MED ORDER — HYDROMORPHONE HCL PF 1 MG/ML IJ SOLN
INTRAMUSCULAR | Status: AC
  Filled 2013-01-23: qty 1

## 2013-01-23 MED ORDER — EPHEDRINE SULFATE 50 MG/ML IJ SOLN
INTRAMUSCULAR | Status: DC | PRN
  Administered 2013-01-23: 10 mg via INTRAVENOUS

## 2013-01-23 MED ORDER — ALUM & MAG HYDROXIDE-SIMETH 200-200-20 MG/5ML PO SUSP: 15.0000 mL | ORAL | Status: DC | PRN

## 2013-01-23 MED ORDER — OXYCODONE-ACETAMINOPHEN 5-325 MG PO TABS
1.0000 | ORAL_TABLET | ORAL | Status: DC | PRN
  Administered 2013-01-24: 1 via ORAL
  Filled 2013-01-23: qty 1

## 2013-01-23 MED ORDER — DOCUSATE SODIUM 100 MG PO CAPS
100.0000 mg | ORAL_CAPSULE | Freq: Every day | ORAL | Status: DC
  Administered 2013-01-24: 100 mg via ORAL
  Filled 2013-01-23 (×2): qty 1

## 2013-01-23 MED ORDER — SODIUM CHLORIDE 0.9 % IR SOLN
Status: DC | PRN
  Administered 2013-01-23: 08:00:00

## 2013-01-23 MED ORDER — HYDROMORPHONE HCL PF 1 MG/ML IJ SOLN
0.2500 mg | INTRAMUSCULAR | Status: DC | PRN
  Administered 2013-01-23: 0.5 mg via INTRAVENOUS

## 2013-01-23 MED ORDER — VECURONIUM BROMIDE 10 MG IV SOLR
INTRAVENOUS | Status: DC | PRN
  Administered 2013-01-23: 1 mg via INTRAVENOUS
  Administered 2013-01-23: 2 mg via INTRAVENOUS

## 2013-01-23 MED ORDER — PHENYLEPHRINE HCL 10 MG/ML IJ SOLN
10.0000 mg | INTRAVENOUS | Status: DC | PRN
  Administered 2013-01-23: 10 ug/min via INTRAVENOUS

## 2013-01-23 MED ORDER — HEPARIN SODIUM (PORCINE) 1000 UNIT/ML IJ SOLN
INTRAMUSCULAR | Status: DC | PRN
  Administered 2013-01-23: 8000 [IU] via INTRAVENOUS

## 2013-01-23 MED ORDER — PHENOL 1.4 % MT LIQD: 1.0000 | OROMUCOSAL | Status: DC | PRN

## 2013-01-23 MED ORDER — NEOSTIGMINE METHYLSULFATE 1 MG/ML IJ SOLN
INTRAMUSCULAR | Status: DC | PRN
  Administered 2013-01-23: 4 mg via INTRAVENOUS

## 2013-01-23 MED ORDER — DEXTROSE-NACL 5-0.45 % IV SOLN
INTRAVENOUS | Status: DC
  Administered 2013-01-23: 75 mL/h via INTRAVENOUS

## 2013-01-23 MED ORDER — LIDOCAINE HCL (CARDIAC) 20 MG/ML IV SOLN
INTRAVENOUS | Status: DC | PRN
  Administered 2013-01-23: 60 mg via INTRAVENOUS

## 2013-01-23 MED ORDER — PANTOPRAZOLE SODIUM 40 MG PO TBEC
40.0000 mg | DELAYED_RELEASE_TABLET | Freq: Every day | ORAL | Status: DC
  Administered 2013-01-23 – 2013-01-24 (×2): 40 mg via ORAL
  Filled 2013-01-23: qty 1

## 2013-01-23 MED ORDER — EPINEPHRINE HCL 0.1 MG/ML IJ SOSY: PREFILLED_SYRINGE | INTRAMUSCULAR | Status: DC | PRN

## 2013-01-23 MED ORDER — ONDANSETRON HCL 4 MG/2ML IJ SOLN
INTRAMUSCULAR | Status: DC | PRN
  Administered 2013-01-23: 4 mg via INTRAVENOUS

## 2013-01-23 MED ORDER — AMLODIPINE BESYLATE 5 MG PO TABS
5.0000 mg | ORAL_TABLET | Freq: Every day | ORAL | Status: DC
  Administered 2013-01-24: 5 mg via ORAL
  Filled 2013-01-23 (×3): qty 1

## 2013-01-23 MED ORDER — ACETAMINOPHEN 650 MG RE SUPP: 325.0000 mg | RECTAL | Status: DC | PRN

## 2013-01-23 MED ORDER — DEXTROSE 5 % IV SOLN
1.5000 g | Freq: Two times a day (BID) | INTRAVENOUS | Status: AC
  Administered 2013-01-23 – 2013-01-24 (×2): 1.5 g via INTRAVENOUS
  Filled 2013-01-23 (×2): qty 1.5

## 2013-01-23 MED ORDER — OXYCODONE HCL 5 MG/5ML PO SOLN: 5.0000 mg | Freq: Once | ORAL | Status: DC | PRN

## 2013-01-23 MED ORDER — POTASSIUM CHLORIDE CRYS ER 20 MEQ PO TBCR: 20.0000 meq | EXTENDED_RELEASE_TABLET | Freq: Once | ORAL | Status: AC | PRN

## 2013-01-23 MED ORDER — THROMBIN 20000 UNITS EX SOLR
CUTANEOUS | Status: AC
  Filled 2013-01-23: qty 20000

## 2013-01-23 MED ORDER — SODIUM CHLORIDE 0.9 % IV SOLN: INTRAVENOUS | Status: DC

## 2013-01-23 MED ORDER — GLYCOPYRROLATE 0.2 MG/ML IJ SOLN
INTRAMUSCULAR | Status: DC | PRN
  Administered 2013-01-23: 0.2 mg via INTRAVENOUS
  Administered 2013-01-23: .8 mg via INTRAVENOUS

## 2013-01-23 MED ORDER — PROPOFOL 10 MG/ML IV BOLUS
INTRAVENOUS | Status: DC | PRN
  Administered 2013-01-23: 150 mg via INTRAVENOUS

## 2013-01-23 MED ORDER — THROMBIN 20000 UNITS EX SOLR
CUTANEOUS | Status: DC | PRN
  Administered 2013-01-23: 09:00:00 via TOPICAL

## 2013-01-23 MED ORDER — CITALOPRAM HYDROBROMIDE 40 MG PO TABS
40.0000 mg | ORAL_TABLET | Freq: Every day | ORAL | Status: DC
  Administered 2013-01-23 – 2013-01-24 (×2): 40 mg via ORAL
  Filled 2013-01-23 (×3): qty 1

## 2013-01-23 MED ORDER — OXYCODONE HCL 5 MG PO TABS: 5.0000 mg | ORAL_TABLET | Freq: Once | ORAL | Status: DC | PRN

## 2013-01-23 MED ORDER — HEPARIN SODIUM (PORCINE) 5000 UNIT/ML IJ SOLN
5000.0000 [IU] | Freq: Three times a day (TID) | INTRAMUSCULAR | Status: DC
  Administered 2013-01-23 – 2013-01-25 (×5): 5000 [IU] via SUBCUTANEOUS
  Filled 2013-01-23 (×9): qty 1

## 2013-01-23 MED ORDER — HYDRALAZINE HCL 20 MG/ML IJ SOLN: 10.0000 mg | INTRAMUSCULAR | Status: DC | PRN

## 2013-01-23 MED ORDER — MORPHINE SULFATE 2 MG/ML IJ SOLN
2.0000 mg | INTRAMUSCULAR | Status: DC | PRN
  Administered 2013-01-23 – 2013-01-24 (×3): 2 mg via INTRAVENOUS
  Filled 2013-01-23 (×3): qty 1

## 2013-01-23 MED ORDER — PANTOPRAZOLE SODIUM 40 MG PO TBEC: 40.0000 mg | DELAYED_RELEASE_TABLET | Freq: Every day | ORAL | Status: DC

## 2013-01-23 MED ORDER — 0.9 % SODIUM CHLORIDE (POUR BTL) OPTIME
TOPICAL | Status: DC | PRN
  Administered 2013-01-23: 2000 mL

## 2013-01-23 MED ORDER — LACTATED RINGERS IV SOLN
INTRAVENOUS | Status: DC | PRN
  Administered 2013-01-23 (×2): via INTRAVENOUS

## 2013-01-23 MED ORDER — METOPROLOL TARTRATE 1 MG/ML IV SOLN: 2.0000 mg | INTRAVENOUS | Status: DC | PRN

## 2013-01-23 MED ORDER — ASPIRIN 81 MG PO CHEW
81.0000 mg | CHEWABLE_TABLET | Freq: Every day | ORAL | Status: DC
Start: 2013-01-23 — End: 2013-01-25
  Administered 2013-01-24: 81 mg via ORAL
  Filled 2013-01-23: qty 1

## 2013-01-23 MED ORDER — GUAIFENESIN-DM 100-10 MG/5ML PO SYRP: 15.0000 mL | ORAL_SOLUTION | ORAL | Status: DC | PRN

## 2013-01-23 MED ORDER — LORAZEPAM 0.5 MG PO TABS: 0.5000 mg | ORAL_TABLET | Freq: Three times a day (TID) | ORAL | Status: DC | PRN

## 2013-01-23 MED ORDER — HYDROCHLOROTHIAZIDE 12.5 MG PO CAPS
12.5000 mg | ORAL_CAPSULE | Freq: Every day | ORAL | Status: DC
  Administered 2013-01-24: 12.5 mg via ORAL
  Filled 2013-01-23 (×3): qty 1

## 2013-01-23 MED ORDER — ONDANSETRON HCL 4 MG/2ML IJ SOLN: 4.0000 mg | Freq: Four times a day (QID) | INTRAMUSCULAR | Status: DC | PRN

## 2013-01-23 MED ORDER — MAGNESIUM SULFATE 40 MG/ML IJ SOLN
2.0000 g | Freq: Once | INTRAMUSCULAR | Status: AC | PRN
  Filled 2013-01-23: qty 50

## 2013-01-23 MED ORDER — MIDAZOLAM HCL 5 MG/5ML IJ SOLN
INTRAMUSCULAR | Status: DC | PRN
  Administered 2013-01-23: 2 mg via INTRAVENOUS

## 2013-01-23 MED ORDER — ROCURONIUM BROMIDE 100 MG/10ML IV SOLN
INTRAVENOUS | Status: DC | PRN
  Administered 2013-01-23: 50 mg via INTRAVENOUS

## 2013-01-23 SURGICAL SUPPLY — 42 items
CANISTER SUCTION 2500CC (MISCELLANEOUS) ×2 IMPLANT
CLIP TI MEDIUM 24 (CLIP) ×2 IMPLANT
CLIP TI WIDE RED SMALL 24 (CLIP) ×2 IMPLANT
COVER SURGICAL LIGHT HANDLE (MISCELLANEOUS) ×2 IMPLANT
DERMABOND ADVANCED (GAUZE/BANDAGES/DRESSINGS) ×2
DERMABOND ADVANCED .7 DNX12 (GAUZE/BANDAGES/DRESSINGS) ×2 IMPLANT
DRAPE WARM FLUID 44X44 (DRAPE) ×2 IMPLANT
ELECT REM PT RETURN 9FT ADLT (ELECTROSURGICAL) ×2
ELECTRODE REM PT RTRN 9FT ADLT (ELECTROSURGICAL) ×1 IMPLANT
GAUZE SPONGE 4X4 16PLY XRAY LF (GAUZE/BANDAGES/DRESSINGS) ×2 IMPLANT
GLOVE BIO SURGEON STRL SZ7.5 (GLOVE) ×2 IMPLANT
GLOVE BIOGEL PI IND STRL 6.5 (GLOVE) ×5 IMPLANT
GLOVE BIOGEL PI IND STRL 7.0 (GLOVE) ×2 IMPLANT
GLOVE BIOGEL PI INDICATOR 6.5 (GLOVE) ×5
GLOVE BIOGEL PI INDICATOR 7.0 (GLOVE) ×2
GLOVE ECLIPSE 6.5 STRL STRAW (GLOVE) ×6 IMPLANT
GLOVE SS BIOGEL STRL SZ 6.5 (GLOVE) ×1 IMPLANT
GLOVE SUPERSENSE BIOGEL SZ 6.5 (GLOVE) ×1
GOWN PREVENTION PLUS XLARGE (GOWN DISPOSABLE) ×2 IMPLANT
GOWN STRL NON-REIN LRG LVL3 (GOWN DISPOSABLE) ×8 IMPLANT
GRAFT PROPATEN THIN WALL 6X80 (Vascular Products) ×2 IMPLANT
KIT BASIN OR (CUSTOM PROCEDURE TRAY) ×2 IMPLANT
KIT ROOM TURNOVER OR (KITS) ×2 IMPLANT
NS IRRIG 1000ML POUR BTL (IV SOLUTION) ×4 IMPLANT
PACK PERIPHERAL VASCULAR (CUSTOM PROCEDURE TRAY) ×2 IMPLANT
PAD ARMBOARD 7.5X6 YLW CONV (MISCELLANEOUS) ×4 IMPLANT
SPONGE SURGIFOAM ABS GEL 100 (HEMOSTASIS) ×2 IMPLANT
SUT PROLENE 5 0 C 1 24 (SUTURE) ×2 IMPLANT
SUT PROLENE 6 0 CC (SUTURE) ×2 IMPLANT
SUT SILK 2 0 SH (SUTURE) ×4 IMPLANT
SUT SILK 3 0 (SUTURE) ×1
SUT SILK 3-0 18XBRD TIE 12 (SUTURE) ×1 IMPLANT
SUT VIC AB 2-0 CTX 36 (SUTURE) ×4 IMPLANT
SUT VIC AB 3-0 SH 27 (SUTURE) ×3
SUT VIC AB 3-0 SH 27X BRD (SUTURE) ×3 IMPLANT
SUT VICRYL 4-0 PS2 18IN ABS (SUTURE) ×2 IMPLANT
TAPE UMBILICAL COTTON 1/8X30 (MISCELLANEOUS) ×2 IMPLANT
TOWEL OR 17X24 6PK STRL BLUE (TOWEL DISPOSABLE) ×6 IMPLANT
TOWEL OR 17X26 10 PK STRL BLUE (TOWEL DISPOSABLE) ×4 IMPLANT
TRAY FOLEY CATH 16FRSI W/METER (SET/KITS/TRAYS/PACK) ×2 IMPLANT
UNDERPAD 30X30 INCONTINENT (UNDERPADS AND DIAPERS) ×2 IMPLANT
WATER STERILE IRR 1000ML POUR (IV SOLUTION) ×2 IMPLANT

## 2013-01-23 NOTE — Anesthesia Procedure Notes (Signed)
Procedure Name: Intubation Performed by: Dairl Ponder Pre-anesthesia Checklist: Patient identified Patient Re-evaluated:Patient Re-evaluated prior to inductionOxygen Delivery Method: Circle system utilized Preoxygenation: Pre-oxygenation with 100% oxygen Intubation Type: IV induction Ventilation: Mask ventilation without difficulty Laryngoscope Size: Mac and 3 Grade View: Grade II Tube type: Oral Tube size: 7.0 mm Number of attempts: 1 Airway Equipment and Method: Stylet Placement Confirmation: ETT inserted through vocal cords under direct vision,  positive ETCO2 and breath sounds checked- equal and bilateral Secured at: 23 cm

## 2013-01-23 NOTE — Preoperative (Signed)
Beta Blockers   Reason not to administer Beta Blockers:Not Applicable 

## 2013-01-23 NOTE — Transfer of Care (Signed)
Immediate Anesthesia Transfer of Care Note  Patient: Denise [name]  Procedure(s) Performed: Procedure(s): BYPASS GRAFT FEMORALTO ABOVE KNEE POPLITEAL ARTERY (Right)  Patient Location: PACU  Anesthesia Type:General  Level of Consciousness: awake, alert  and oriented  Airway & Oxygen Therapy: Patient Spontanous Breathing  Post-op Assessment: Report given to PACU RN  Post vital signs: Reviewed and stable  Complications: No apparent anesthesia complications

## 2013-01-23 NOTE — H&P (Signed)
[facility] [place]  HISTORY AND PHYSICAL  History of Present Illness: Patient is a 67 y.o. year old female who presents for evaluation of right leg pain. She develops pain in the calf in her right leg that occurs after walking 50-60 feet. This has been present for approximately one year. The pain is relieved for 5 minutes of rest. She has no symptoms in the left leg. She has had no prior problems with nonhealing wounds or ulcers on the feet. She denies rest pain. She currently is smoking one half pack per day of cigarettes. Greater than 3 minutes they were spent regarding smoking cessation counseling. She has taken Chantix in the past with no complication. Other medical problems include hyperlipidemia, hypertension, coronary disease, prior history of stroke with residual left arm weakness (no significant stenosis on carotid duplex 2013). These are all currently stable. She recently had an arteriogram which showed a lengthy right superficial femoral artery occlusion with reconstitution of the above-knee popliteal artery and three-vessel runoff to the right foot. In the left lower extremity she had a posterior tibial artery occlusion but otherwise all vessels were patent in the left lower extremity.  Past Medical History   Diagnosis  Date   .  GERD (gastroesophageal reflux disease)    .  Hyperlipidemia    .  Hypertension    .  Heart attack    .  Coronary artery disease  09/09/2011     stress - non-gated; nod/severe perfusion defect in basal/mid inferior, basa/mid inferolateral and apical lateral regions consistient w. scar/infarct; 2009 stress test EF 53% mild/mod perfusion defect   .  Shortness of breath    .  Stroke      many years ago, "light stroke"   .  Weakness of left leg  01/20/2004     left arm/leg both weak, normal echo, no signs emboli   .  Cerebral atherosclerosis  02/19/2010     doppler -L ICA mid 0-49% reduction   .  Bradycardia, drug induced      resolved w/ d/c  bystolic   .  Gall bladder disease    .  PAF (paroxysmal atrial fibrillation)      post operative in 2009, no recurrences   .  COPD (chronic obstructive pulmonary disease)     Past Surgical History   Procedure  Laterality  Date   .  Abdominal hysterectomy   1978, 1982     partial, then complete   .  Coronary artery bypass graft   01/2008     x4; LIMA to LAD, SVG to diagonal, SVG to circumflex marginal, SVG to RCA with EVH of R lower extremity   .  Cholecystectomy   10/14/2011     Procedure: LAPAROSCOPIC CHOLECYSTECTOMY WITH INTRAOPERATIVE CHOLANGIOGRAM; Surgeon: Robyne Askew, MD; Location: [facility] OR; Service: General; Laterality: N/A;   .  Tee without cardioversion   02/07/1999     used intraoperatively w/ CABG to assess LV function,   .  Cardiac catheterization   12/15/20091     severe 3 vessel disease, needing CABG, EF 40-45%   left subclavian stent by Dr. Alanda Amass in 1999.  Social History  History    Substance Use Topics   .  Smoking status:  Current Every Day Smoker -- 0.50 packs/day     Types:  Cigarettes   .  Smokeless tobacco:  Never Used   .  Alcohol Use:  No      Comment:  occasional   Family History  Family History    Problem  Relation  Age of Onset   .  Heart failure  Mother    .  Heart disease  Mother    .  Hyperlipidemia  Mother    .  Cancer  Father      bone   Allergies  Allergies    Allergen  Reactions   .  Beta Adrenergic Blockers  Other (See Comments)     Significant bradycardia   .  Codeine  Nausea Only     dizziness   .  Statins  Other (See Comments)     Muscle aches w/ statins   .  Zetia [Ezetimibe]  Other (See Comments)     Muscle aches    Current Outpatient Prescriptions   Medication  Sig  Dispense  Refill   .  amLODipine (NORVASC) 5 MG tablet  Take 5 mg by mouth daily.     Marland Kitchen  aspirin 81 MG tablet  Take 81 mg by mouth daily.     .  cetirizine (ZYRTEC) 10 MG tablet  Take 10 mg by mouth daily.     .  citalopram (CELEXA) 40 MG tablet  Take 40 mg  by mouth daily.     Marland Kitchen  LORazepam (ATIVAN) 0.5 MG tablet  Take 0.5 mg by mouth every 8 (eight) hours as needed. For pain     .  MICARDIS HCT 40-12.5 MG per tablet  Take 1 tablet by mouth daily.     .  Omega-3 Fatty Acids (FISH OIL) 1000 MG CAPS  Take 2 capsules by mouth daily.     Marland Kitchen  omeprazole (PRILOSEC) 20 MG capsule  Take 20 mg by mouth daily.     Marland Kitchen  ZETIA 10 MG tablet  Take 1 tablet by mouth daily.     .  furosemide (LASIX) 20 MG tablet  Take 20 mg by mouth as needed.      No current facility-administered medications for this visit.   ROS:  Cardiac: No recent episodes of chest pain/pressure, no shortness of breath at rest. + shortness of breath with exertion. Denies history of atrial fibrillation or irregular heartbeat  Vascular: No history of rest pain in feet. + history of claudication. No history of non-healing ulcer, No history of DVT  Pulmonary: No home oxygen, no productive cough, no hemoptysis, + wheezing  Psychological: No history of major depression  Physical Examination  Filed Vitals:   01/23/13 0551  BP: 147/50  Pulse: 61  Temp: 98.2 F (36.8 C)  TempSrc: Oral  Resp: 20  SpO2: 100%   General: Alert and oriented, no acute distress  HEENT: Normal  Skin: No rash, no ulcer  Extremity Pulses: 2+ femoral, bilaterally left 2+ dorsalis pedis pulse absent dorsalis pedis, posterior tibial pulses right  Musculoskeletal: No deformity or edema  Neurologic: Upper and lower extremity motor 5/5 and symmetric  DATA: The patient had prior which were 0.62 on the right 0.94 on the left the patient also had an arterial duplex scan of the right leg which showed a mid superficial femoral artery stenosis. I reviewed and interpreted this study.  ASSESSMENT: Right lower extremity claudication.  PLAN: Right fem pop bypass today  Fabienne Bruns, MD  [facility] [place]  Office: [phone]  Pager: [phone]

## 2013-01-23 NOTE — Op Note (Signed)
Procedure: Right femoral to above-knee popliteal bypass Preoperative diagnosis: Claudication right foot  Postoperative diagnosis: Same  Anesthesia: General  Asst.: Lanita Presson, PA-C  Operative findings: #1  6 mm Propaten  Operative details: After obtaining informed consent, the patient was taken to the operating room. The patient was placed in supine position on the operating room table. After induction of general anesthesia and endotracheal intubation, a Foley catheter was placed. Next, the patient's entire right lower extremity was prepped and draped in the usual sterile fashion. An oblique incision was then made in the right groin and carried down through the subcutaneous tissues to expose the right common femoral artery. The common femoral artery was dissected free circumferentially. There was a pulse within the common femoral artery. The distal external iliac artery was dissected free circumferentially underneath the inguinal ligament. A vessel loop was also placed around the distal external iliac artery. Dissection was then carried out the level of the femoral bifurcation. The superficial femoral and profunda femoris arteries were dissected free circumferentially and vessel loops placed around these.  Next the right greater saphenous vein was dissected free in the medial right leg. The vein was of good quality but had been surgically removed 4 cm from its origin.  At this point a 6 mm Propaten graft was openied.  Next, a longitudinal incision was made on the medial aspect of the right leg above the knee.  The incision was deepened down to the level of the fascia. The fascia was opened and the sartorius muscle reflected posteriorly. The above-knee popliteal space was entered. Dissection was carried down to the level of the above-knee popliteal artery this was dissected free circumferentially.Several centimeters of the popliteal artery was dissected free to make a reasonable area for grafting of the  above-knee popliteal distal anastomosis.  A subsartorial tunnel was created connecting the above-knee incision to the groin. The patient was then given 8000 units of intravenous heparin.   After appropriate circulation time of the heparin, the distal right external iliac artery was controlled with a small Cooley clamp. The profunda femoris and superficial femoral arteries were controlled with Vesseloops. A longitudinal opening was made in the common femoral artery just above the femoral bifurcation. The was then spatulated and sewn end of graft to side of artery using a running 5-0 Prolene suture. At completion it was forebled backbled and thoroughly flushed.  The graft was then brought through the subsartorial tunnel down to the above-knee popliteal artery. The above-knee popliteal artery was controlled proximally with a Henley clamp and distally with a Henley clamp. A longitudinal opening was made in the distal above-knee popliteal artery in an area that was fairly free of calcification. The graft was then cut to length and spatulated and signed end of graft to side of artery using running 6-0 Prolene suture. At completion of the anastomosis everything was forebled backbled and thoroughly flushed. The remainder of the anastomosis was completed and all clamps were removed restoring pulsatile flow to the above-knee popliteal artery.   The patient had monophasic to biphasic Doppler flow in the dorsalis pedis and PT area of the foot. This augmented approximately 75% with unclamping the graft.  After hemostasis was obtained with thrombin and gelfoam, the deep layers and subcutaneous layers of the above-knee popliteal incision were closed with running 3-0 Vicryl suture. The skin was closed with a 4-0 Vicryl subcuticular stitch. The groin was inspected and found to be hemostatic. This was then closed in multiple layers of running 2  0 and 3-0 Vicryl suture and 4-0 subcuticular stitch. The patient tolerated the  procedure well and there were no complications. Instrument sponge and needle counts correct in the case. Patient was taken to the recovery in stable condition.   Fabienne Bruns, MD  [facility] [place]  Office: [phone]  Pager: [phone]

## 2013-01-23 NOTE — Anesthesia Preprocedure Evaluation (Addendum)
Anesthesia Evaluation  Patient identified by MRN, date of birth, ID band Patient awake    Reviewed: Allergy & Precautions, H&P , NPO status , Patient's Chart, lab work & pertinent test results, reviewed documented beta blocker date and time   Airway Mallampati: II TM Distance: >3 FB Neck ROM: full    Dental  (+) Teeth Intact   Pulmonary shortness of breath and with exertion, COPDCurrent Smoker,  breath sounds clear to auscultation        Cardiovascular hypertension, + CAD, + Past MI, + CABG and + Peripheral Vascular Disease + dysrhythmias Atrial Fibrillation Rhythm:regular     Neuro/Psych CVA, No Residual Symptoms negative psych ROS   GI/Hepatic Neg liver ROS, GERD-  Medicated and Controlled,  Endo/Other  negative endocrine ROS  Renal/GU negative Renal ROS  negative genitourinary   Musculoskeletal   Abdominal   Peds  Hematology negative hematology ROS (+)   Anesthesia Other Findings See surgeon's H&P   Reproductive/Obstetrics negative OB ROS                          Anesthesia Physical Anesthesia Plan  ASA: III  Anesthesia Plan: General   Post-op Pain Management:    Induction: Intravenous  Airway Management Planned: Oral ETT  Additional Equipment:   Intra-op Plan:   Post-operative Plan: Extubation in OR  Informed Consent: I have reviewed the patients History and Physical, chart, labs and discussed the procedure including the risks, benefits and alternatives for the proposed anesthesia with the patient or authorized representative who has indicated his/her understanding and acceptance.   Dental Advisory Given  Plan Discussed with: CRNA, Surgeon and Anesthesiologist  Anesthesia Plan Comments:        Anesthesia Quick Evaluation

## 2013-01-23 NOTE — Anesthesia Postprocedure Evaluation (Signed)
Anesthesia Post Note  Patient: Denise [name]  Procedure(s) Performed: Procedure(s) (LRB): BYPASS GRAFT FEMORALTO ABOVE KNEE POPLITEAL ARTERY (Right)  Anesthesia type: general  Patient location: PACU  Post pain: Pain level controlled  Post assessment: Patient's Cardiovascular Status Stable  Last Vitals:  Filed Vitals:   01/23/13 1200  BP: 126/46  Pulse: 73  Temp:   Resp: 14    Post vital signs: Reviewed and stable  Level of consciousness: sedated  Complications: No apparent anesthesia complications

## 2013-01-24 DIAGNOSIS — Z48812 Encounter for surgical aftercare following surgery on the circulatory system: Secondary | ICD-10-CM

## 2013-01-24 LAB — CBC
HCT: 35.9 % — ABNORMAL LOW (ref 36.0–46.0)
MCH: 30.8 pg (ref 26.0–34.0)
MCHC: 31.8 g/dL (ref 30.0–36.0)
Platelets: 187 10*3/uL (ref 150–400)
RBC: 3.7 MIL/uL — ABNORMAL LOW (ref 3.87–5.11)
RDW: 13.6 % (ref 11.5–15.5)
WBC: 7.5 10*3/uL (ref 4.0–10.5)

## 2013-01-24 LAB — BASIC METABOLIC PANEL
CO2: 26 mEq/L (ref 19–32)
Calcium: 8.3 mg/dL — ABNORMAL LOW (ref 8.4–10.5)
GFR calc Af Amer: >90 mL/min (ref >90)
GFR calc non Af Amer: >90 mL/min (ref >90)
Glucose, Bld: 154 mg/dL — ABNORMAL HIGH (ref 70–99)
Sodium: 134 mEq/L — ABNORMAL LOW (ref 135–145)

## 2013-01-24 MED ORDER — OXYCODONE HCL 5 MG PO TABS
5.0000 mg | ORAL_TABLET | Freq: Four times a day (QID) | ORAL | Status: DC | PRN
  Administered 2013-01-24 – 2013-01-25 (×3): 5 mg via ORAL
  Filled 2013-01-24 (×3): qty 1

## 2013-01-24 MED ORDER — OXYCODONE HCL 5 MG PO TABS: 5.0000 mg | ORAL_TABLET | Freq: Four times a day (QID) | ORAL | Status: DC | PRN

## 2013-01-24 NOTE — Evaluation (Signed)
Physical Therapy Evaluation Patient Details Name: Denise [name] MRN: [id number] DOB: February 12, 1946 Today's Date: 01/24/2013 Time: 4098-1191 PT Time Calculation (min): 12 min  PT Assessment / Plan / Recommendation History of Present Illness  Patient is a 67 yo female s/p Rt fem-pop BPG.  Clinical Impression  Patient is independent with all mobility and gait.  Good balance.  No acute PT needs identified - PT will sign off.  Encouraged ambulation in hallway with nursing.    PT Assessment  Patent does not need any further PT services    Follow Up Recommendations  No PT follow up;Supervision - Intermittent    Does the patient have the potential to tolerate intense rehabilitation      Barriers to Discharge        Equipment Recommendations  None recommended by PT    Recommendations for Other Services     Frequency      Precautions / Restrictions Precautions Precautions: None Restrictions Weight Bearing Restrictions: No   Pertinent Vitals/Pain       Mobility  Bed Mobility Bed Mobility: Supine to Sit;Sit to Supine Supine to Sit: 7: Independent Sit to Supine: 7: Independent Transfers Transfers: Sit to Stand;Stand to Sit Sit to Stand: 7: Independent;From bed Stand to Sit: 7: Independent;To bed Ambulation/Gait Ambulation/Gait Assistance: 7: Independent Ambulation Distance (Feet): 40 Feet (Had just walked in hallway) Assistive device: None Ambulation/Gait Assistance Details: Patient with good gait pattern (slight antalgic gait), balance, and speed.  No assist needed. Gait Pattern: Antalgic        PT Goals(Current goals can be found in the care plan section)  N/a  Visit Information  Last PT Received On: 01/24/13 Assistance Needed: +1 History of Present Illness: Patient is a 67 yo female s/p Rt fem-pop BPG.       Prior Functioning  Home Living Family/patient expects to be discharged to:: Private residence Living Arrangements: Children Available Help at  Discharge: Family;Friend(s);Available 24 hours/day Type of Home: House Home Access: Stairs to enter [employer] of Steps: 3 Entrance Stairs-Rails: Right Home Layout: One level Home Equipment: Walker - 2 wheels;Crutches;Shower seat Prior Function Level of Independence: Independent Communication Communication: No difficulties    Cognition  Cognition Arousal/Alertness: Awake/alert Behavior During Therapy: WFL for tasks assessed/performed Overall Cognitive Status: Within Functional Limits for tasks assessed    Extremity/Trunk Assessment Upper Extremity Assessment Upper Extremity Assessment: Overall WFL for tasks assessed Lower Extremity Assessment Lower Extremity Assessment: RLE deficits/detail RLE Deficits / Details: Slight decrease in strength and ROM due to surgery/pain/edema RLE: Unable to fully assess due to pain Cervical / Trunk Assessment Cervical / Trunk Assessment: Normal   Balance    End of Session PT - End of Session Equipment Utilized During Treatment: Gait belt Activity Tolerance: Patient tolerated treatment well Patient left: in bed;with call bell/phone within reach Nurse Communication: Mobility status (Encouraged ambulation in hallway)  GP     Vena Austria 01/24/2013, 5:23 PM Denise [name]. Denise [name], [facility] Acute Rehab Services Pager [phone]

## 2013-01-24 NOTE — Progress Notes (Signed)
Pt progressing nicely, r/a, pain controlled on POs, BRP, amb unit, voided am.

## 2013-01-24 NOTE — Progress Notes (Signed)
Patient states she has been ambulating around the unit since transferring over today.  I encouraged her to let me know if she would like to walk some more with me this evening.  Will continue to monitor.  Arva Chafe

## 2013-01-24 NOTE — Progress Notes (Signed)
OT Cancellation Note  Patient Details Name: Denise [name] MRN: [id number] DOB: 15-Jan-1946   Cancelled Treatment:    Reason Eval/Treat Not Completed: OT screened, no needs identified, will sign off  [facility] Gilmer Kaminsky, OTR/L  [phone] 01/24/2013 01/24/2013, 6:00 PM

## 2013-01-24 NOTE — Progress Notes (Signed)
Utilization review completed.  

## 2013-01-24 NOTE — Progress Notes (Signed)
VASCULAR LAB PRELIMINARY  ARTERIAL  ABI completed:    RIGHT    LEFT    PRESSURE WAVEFORM  PRESSURE WAVEFORM  BRACHIAL 123 triphasic BRACHIAL 91 monophasic  DP   DP    AT 78 biphasic AT 102 biphasic  PT 116 monophasic PT 100 Dampened monophasic  PER   PER    GREAT TOE  NA GREAT TOE  NA    RIGHT LEFT  ABI 0.94 0.83     Denise [name], RVT 01/24/2013, 1:37 PM

## 2013-01-24 NOTE — Progress Notes (Signed)
Physical Therapy Discharge Patient Details Name: Denise [name] MRN: [id number] DOB: 10/17/1945 Today's Date: 01/24/2013 Time: 4010-2725 PT Time Calculation (min): 12 min  Patient discharged from PT services secondary to goals met and no further PT needs identified.  Please see latest therapy progress note for current level of functioning and progress toward goals.    Progress and discharge plan discussed with patient and/or caregiver: Patient/Caregiver agrees with plan  GP     Vena Austria 01/24/2013, 5:24 PM

## 2013-01-24 NOTE — Progress Notes (Addendum)
[facility] Progress Note  01/24/2013 7:37 AM 1 Day Post-Op  Subjective:  C/o soreness at incisions  Tm 99.2 110's-140's systolic HR 60's-70's regular 40% RA Filed Vitals:   01/24/13 0400  BP: 143/51  Pulse: 74  Temp: 99.2 F (37.3 C)  Resp:     Physical Exam: Incisions:  Right groin as well as above knee incision are c/d/i.  Extremities:  Right foot with palpable DP/PT.  Right foot is warm and well perfused.  CBC    Component Value Date/Time   WBC 7.5 01/24/2013 0410   RBC 3.70* 01/24/2013 0410   HGB 11.4* 01/24/2013 0410   HCT 35.9* 01/24/2013 0410   PLT 187 01/24/2013 0410   MCV 97.0 01/24/2013 0410   MCH 30.8 01/24/2013 0410   MCHC 31.8 01/24/2013 0410   RDW 13.6 01/24/2013 0410   LYMPHSABS 3.4 10/14/2011 0619   MONOABS 0.9 10/14/2011 0619   EOSABS 0.6 10/14/2011 0619   BASOSABS 0.1 10/14/2011 0619    BMET    Component Value Date/Time   NA 134* 01/24/2013 0410   K 4.4 01/24/2013 0410   CL 103 01/24/2013 0410   CO2 26 01/24/2013 0410   GLUCOSE 154* 01/24/2013 0410   BUN 7 01/24/2013 0410   CREATININE 0.60 01/24/2013 0410   CALCIUM 8.3* 01/24/2013 0410   GFRNONAA >90 01/24/2013 0410   GFRAA >90 01/24/2013 0410    INR    Component Value Date/Time   INR 0.95 01/22/2013 0835     Intake/Output Summary (Last 24 hours) at 01/24/13 0737 Last data filed at 01/23/13 2354  Gross per 24 hour  Intake   1800 ml  Output   1425 ml  Net    375 ml     Assessment:  67 y.o. female is s/p:  Right femoral to above-knee popliteal bypass  1 Day Post-Op  Plan: -pt doing well this am -ABI's today -(has not required gtts for BP support) -DVT prophylaxis: heparin SQ 5000 units tid -mobilize and OOB; PT to work with pt today -transfer to 2000 -possibly home in the next couple of days.   Doreatha Massed, PA-C [facility] [phone] 01/24/2013 7:37 AM   History and exam as above Ambulate in hall today Possible d/c  tomorrow  Fabienne Bruns, MD [facility] [place] Office: [phone] Pager: [phone]

## 2013-01-24 NOTE — Progress Notes (Signed)
Pt to TX to 2w-33, VSS, called report.

## 2013-01-25 ENCOUNTER — Encounter (HOSPITAL_COMMUNITY): Payer: Self-pay | Admitting: Vascular Surgery

## 2013-01-25 MED ORDER — OXYCODONE HCL 5 MG PO TABS: 5.0000 mg | ORAL_TABLET | Freq: Four times a day (QID) | ORAL | Status: DC | PRN

## 2013-01-25 NOTE — Progress Notes (Addendum)
[facility] Progress Note  01/25/2013 7:50 AM 2 Days Post-Op  Subjective:  C/o soreness  Tm 99.7 now 99 VSS 97% RA  Filed Vitals:   01/25/13 0530  BP: 105/47  Pulse: 80  Temp: 99 F (37.2 C)  Resp: 18    Physical Exam: Incisions:  C/d/i Extremities:  + palpable right PT; right foot is warm and well perfused.  CBC    Component Value Date/Time   WBC 7.5 01/24/2013 0410   RBC 3.70* 01/24/2013 0410   HGB 11.4* 01/24/2013 0410   HCT 35.9* 01/24/2013 0410   PLT 187 01/24/2013 0410   MCV 97.0 01/24/2013 0410   MCH 30.8 01/24/2013 0410   MCHC 31.8 01/24/2013 0410   RDW 13.6 01/24/2013 0410   LYMPHSABS 3.4 10/14/2011 0619   MONOABS 0.9 10/14/2011 0619   EOSABS 0.6 10/14/2011 0619   BASOSABS 0.1 10/14/2011 0619    BMET    Component Value Date/Time   NA 134* 01/24/2013 0410   K 4.4 01/24/2013 0410   CL 103 01/24/2013 0410   CO2 26 01/24/2013 0410   GLUCOSE 154* 01/24/2013 0410   BUN 7 01/24/2013 0410   CREATININE 0.60 01/24/2013 0410   CALCIUM 8.3* 01/24/2013 0410   GFRNONAA >90 01/24/2013 0410   GFRAA >90 01/24/2013 0410    INR    Component Value Date/Time   INR 0.95 01/22/2013 0835     Intake/Output Summary (Last 24 hours) at 01/25/13 0750 Last data filed at 01/24/13 1700  Gross per 24 hour  Intake    480 ml  Output    700 ml  Net   -220 ml     ABI's 01/24/13:  RIGHT    LEFT     PRESSURE  WAVEFORM   PRESSURE  WAVEFORM   BRACHIAL  123  triphasic  BRACHIAL  91  monophasic   DP    DP     AT  78  biphasic  AT  102  biphasic   PT  116  monophasic  PT  100  Dampened monophasic   PER    PER     GREAT TOE   NA  GREAT TOE   NA     RIGHT  LEFT   ABI  0.94  0.83      Assessment:  67 y.o. female is s/p:  Right femoral to above-knee popliteal bypass   2 Days Post-Op  Plan: -doing well this am bypass is patent -ambulating in halls without difficulty -discharge home today and f/u with Dr. Darrick Penna in 2 weeks. -discussed groin wound  care-pt expresses understanding    Doreatha Massed, PA-C [facility] [phone] 01/25/2013 7:50 AM    2+ DP pulse, incisions healing D/c home today  Fabienne Bruns, MD [facility] [place] Office: [phone] Pager: [phone]

## 2013-01-25 NOTE — Progress Notes (Addendum)
01/25/2013 10:52 AM Nursing note Discharge avs form, medications already taken today and those due this evening given and explained to patient. PT. Declined to take her morning medication prior to d/c home. She wished to resume medications upon arrival home. Follow up appointments, activity restrictions, incision site care and when to call MD reviewed. D/c iv line. D/c tele. D/c home with family per orders.  Eleana Tocco, [name] Kelch

## 2013-01-25 NOTE — Discharge Summary (Addendum)
[facility] Discharge Summary  Denise [name] 1945-03-15 67 y.o. female  [id number]  Admission Date: 01/23/2013  Discharge Date: 01/25/13  Physician: Sherren Kerns, MD  Admission Diagnosis: Peripheral Vascular Disease   HPI:   This is a 67 y.o. female who presents for evaluation of right leg pain. She develops pain in the calf in her right leg that occurs after walking 50-60 feet. This has been present for approximately one year. The pain is relieved for 5 minutes of rest. She has no symptoms in the left leg. She has had no prior problems with nonhealing wounds or ulcers on the feet. She denies rest pain. She currently is smoking one half pack per day of cigarettes. Greater than 3 minutes they were spent regarding smoking cessation counseling. She has taken Chantix in the past with no complication. Other medical problems include hyperlipidemia, hypertension, coronary disease, prior history of stroke with residual left arm weakness (no significant stenosis on carotid duplex 2013). These are all currently stable. She recently had an arteriogram which showed a lengthy right superficial femoral artery occlusion with reconstitution of the above-knee popliteal artery and three-vessel runoff to the right foot. In the left lower extremity she had a posterior tibial artery occlusion but otherwise all vessels were patent in the left lower extremity.   Hospital Course:  The patient was admitted to the hospital and taken to the operating room on 01/23/2013 and underwent: Right femoral to above-knee popliteal bypass    The pt tolerated the procedure well and was transported to the PACU in good condition.   By POD 1, pt was doing well with a patent bypass graft and a palpable right DP.  On POD 2, she continued to do well and was discharged home.  Her post operative ABI's are as follows:   RIGHT    LEFT     PRESSURE  WAVEFORM   PRESSURE  WAVEFORM   BRACHIAL  123  triphasic   BRACHIAL  91  monophasic   DP    DP     AT  78  biphasic  AT  102  biphasic   PT  116  monophasic  PT  100  Dampened monophasic   PER    PER     GREAT TOE   NA  GREAT TOE   NA     RIGHT  LEFT   ABI  0.94  0.83     The remainder of the hospital course consisted of increasing mobilization and increasing intake of solids without difficulty.  CBC    Component Value Date/Time   WBC 7.5 01/24/2013 0410   RBC 3.70* 01/24/2013 0410   HGB 11.4* 01/24/2013 0410   HCT 35.9* 01/24/2013 0410   PLT 187 01/24/2013 0410   MCV 97.0 01/24/2013 0410   MCH 30.8 01/24/2013 0410   MCHC 31.8 01/24/2013 0410   RDW 13.6 01/24/2013 0410   LYMPHSABS 3.4 10/14/2011 0619   MONOABS 0.9 10/14/2011 0619   EOSABS 0.6 10/14/2011 0619   BASOSABS 0.1 10/14/2011 0619    BMET    Component Value Date/Time   NA 134* 01/24/2013 0410   K 4.4 01/24/2013 0410   CL 103 01/24/2013 0410   CO2 26 01/24/2013 0410   GLUCOSE 154* 01/24/2013 0410   BUN 7 01/24/2013 0410   CREATININE 0.60 01/24/2013 0410   CALCIUM 8.3* 01/24/2013 0410   GFRNONAA >90 01/24/2013 0410   GFRAA >90 01/24/2013 0410     Discharge Instructions:  The patient is discharged to home with extensive instructions on wound care and progressive ambulation.  They are instructed not to drive or perform any heavy lifting until returning to see the physician in his office.  Discharge Orders   Future Appointments Provider Department Dept Phone   02/27/2013 9:00 AM Evie Lacks [facility] [place] Office [phone]   06/14/2013 2:00 PM [facility] [place]  CARDIOVASCULAR [address] [phone]   06/14/2013 2:30 PM [facility] [place] CARDIOVASCULAR [address] [phone]   06/14/2013 3:30 PM Sherren Kerns, MD [facility] [phone]   Future Orders Complete By Expires   Call MD for:  redness, tenderness, or signs of infection (pain, swelling, bleeding, redness, odor or green/yellow discharge around  incision site)  As directed    Call MD for:  severe or increased pain, loss or decreased feeling  in affected limb(s)  As directed    Call MD for:  temperature >100.5  As directed    Discharge wound care:  As directed    Comments:     Wash the groin wound with soap and water daily and pat dry. (No tub bath-only shower)  Then put a dry gauze or washcloth there to keep this area dry daily and as needed.  Do not use Vaseline or neosporin on your incisions.  Only use soap and water on your incisions and then protect and keep dry.   Driving Restrictions  As directed    Comments:     No driving for 2 weeks   Lifting restrictions  As directed    Comments:     No lifting for 2 weeks   Resume previous diet  As directed       Discharge Diagnosis:  Peripheral Vascular Disease  Secondary Diagnosis: Patient Active Problem List   Diagnosis Date Noted  . Claudication of right lower extremity 01/23/2013  . Essential hypertension 01/08/2013  . Hyperlipidemia 01/08/2013  . PVD (peripheral vascular disease) 12/07/2012  . Atherosclerosis of native arteries of the extremities with intermittent claudication 10/05/2012  . Cholelithiasis 09/02/2011   Past Medical History  Diagnosis Date  . GERD (gastroesophageal reflux disease)   . Hyperlipidemia   . Hypertension   . Coronary artery disease 09/09/2011    stress - non-gated; nod/severe perfusion defect in basal/mid inferior, basa/mid inferolateral and apical lateral regions consistient w. scar/infarct; 2009 stress test EF 53% mild/mod perfusion defect  . Shortness of breath   . Stroke     many years ago, "light stroke"  . Weakness of left leg 01/20/2004    left arm/leg both weak, normal echo, no signs emboli  . Cerebral atherosclerosis 02/19/2010    doppler -L ICA mid 0-49% reduction  . Bradycardia, drug induced     resolved w/ d/c bystolic  . Gall bladder disease   . Heart attack ~ 17 years ago  . COPD (chronic obstructive pulmonary disease)   .  PAF (paroxysmal atrial fibrillation)     post operative in 2009, no recurrences       Medication List         amLODipine 5 MG tablet  Commonly known as:  NORVASC  Take 5 mg by mouth daily.     aspirin 81 MG tablet  Take 81 mg by mouth daily.     cetirizine 10 MG tablet  Commonly known as:  ZYRTEC  Take 10 mg by mouth daily.     citalopram 40 MG tablet  Commonly known as:  CELEXA  Take 40 mg by mouth daily.     Fish Oil 1000 MG Caps  Take 1 capsule by mouth 2 (two) times daily.     LORazepam 0.5 MG tablet  Commonly known as:  ATIVAN  Take 0.5 mg by mouth every 8 (eight) hours as needed for anxiety.     MICARDIS HCT 40-12.5 MG per tablet  Generic drug:  telmisartan-hydrochlorothiazide  Take 1 tablet by mouth daily.     omeprazole 20 MG capsule  Commonly known as:  PRILOSEC  Take 20 mg by mouth daily.     oxyCODONE 5 MG immediate release tablet  Commonly known as:  ROXICODONE  Take 1 tablet (5 mg total) by mouth every 6 (six) hours as needed for severe pain.     ZETIA 10 MG tablet  Generic drug:  ezetimibe  Take 1 tablet by mouth 3 (three) times a week. Tuesday, Thursday, Sunday.        Roxicodone #30 No Refill  Disposition: home  Patient's condition: is Good  Follow up: 1. Dr. Darrick Penna in 2 weeks   Doreatha Massed, PA-C [facility] [phone] 01/25/2013  7:53 AM  - For VQI Registry use --- Instructions: Press F2 to tab through selections.  Delete question if not applicable.   Post-op:  Wound infection: No  Graft infection: No  Transfusion: No  If yes, n/a units given New Arrhythmia: No Ipsilateral amputation: No, [ ]  Minor, [ ]  BKA, [ ]  AKA Discharge patency: [x ] Primary, [ ]  Primary assisted, [ ]  Secondary, [ ]  Occluded Patency judged by: [ ]  Dopper only, [ ]  Palpable graft pulse, [ x] Palpable distal pulse, [ ]  ABI inc. > 0.15, [ ]  Duplex Discharge ABI: R 0.94, L 0.83 Discharge TBI: R , L  D/C Ambulatory Status:  Ambulatory  Complications: MI: No, [ ]  Troponin only, [ ]  EKG or Clinical CHF: No Resp failure:No, [ ]  Pneumonia, [ ]  Ventilator Chg in renal function: No, [ ]  Inc. Cr > 0.5, [ ]  Temp. Dialysis, [ ]  Permanent dialysis Stroke: No, [ ]  Minor, [ ]  Major Return to OR: No  Reason for return to OR: [ ]  Bleeding, [ ]  Infection, [ ]  Thrombosis, [ ]  Revision  Discharge medications: Statin use:  No  for medical reason allergy ASA use:  Yes Plavix use:  No  for medical reason not indicated Beta blocker use: No  for medical reason allergy Coumadin use: No  for medical reason not indicated

## 2013-01-26 ENCOUNTER — Telehealth: Payer: Self-pay | Admitting: Vascular Surgery

## 2013-01-26 NOTE — Telephone Encounter (Addendum)
Message copied by Fredrich Birks on Fri Jan 26, 2013  2:43 PM ------      Message from: Sharee Pimple      Created: Fri Jan 26, 2013 12:17 PM      Regarding: FW: schedule       See the message below from the Chief.                   ----- Message -----         From: Sherren Kerns, MD         Sent: 01/26/2013  11:47 AM           To: Sharee Pimple, CMA      Subject: RE: schedule                                             4 weeks is ok.  Just have her come sooner if she has redness or drainage from incisions            Leonette Most      ----- Message -----         From: Sharee Pimple, CMA         Sent: 01/26/2013  10:00 AM           To: Sherren Kerns, MD, Fredrich Birks      Subject: FW: schedule                                             Because of xmas and time off, you can't see until 4 weeks per Annabelle Harman, is this OK or should we bring her in sooner with Rosalita Chessman?            ----- Message -----         From: Sharee Pimple, CMA         Sent: 01/24/2013   9:16 AM           To: [facility] Admin Pool      Subject: schedule                                                             ----- Message -----         From: Dara Lords, PA-C         Sent: 01/24/2013   7:48 AM           To: Sharee Pimple, CMA, [facility] Admin Pool            S/p right fem pop 01/23/13.  F/u with Dr. Darrick Penna in 2 weeks.            Thanks,      Samantha                   ------  01/26/13: spoke with pt to schedule appt, dpm

## 2013-01-30 ENCOUNTER — Telehealth: Payer: Self-pay

## 2013-01-30 NOTE — Telephone Encounter (Signed)
Phone call from pt. To report large amount of swelling of right foot and leg.  States she noted that it seemed to increase over the weekend (12/12 and 12/13)  Denies elevating the right leg.  Stated her children think she is trying to do too much.  Stated she has a hard time being still.  Stated "the incision looks good."  Denied any fever or chills.  Encouraged pt. to elevate her legs above the level of the heart a minimum of 30 minute intervals 5-6 times/day.  Encouraged her to maintain mobility, and to increase her walking gradually, as tolerated, but to be aware of need to elevate intermittently.  Verb. Understanding.  Pt. knows to call back if symptoms don't improve.

## 2013-02-01 ENCOUNTER — Ambulatory Visit: Payer: Medicare Other | Admitting: Vascular Surgery

## 2013-02-21 ENCOUNTER — Telehealth: Payer: Self-pay

## 2013-02-21 NOTE — Telephone Encounter (Signed)
Phone call from pt.  Reports noticing a dark area at end of incision proximal to right knee; today noticed the dark area is gone, and can see open area with "white string visible"; c/o redness and tenderness surrounding the small open area.  Denies drainage.  States has had some chills recently.  Denies fever or chills today.  Will attempt to move pt's appt. From 1/15 to 1/8.  Will contact pt. About appt.

## 2013-02-22 ENCOUNTER — Ambulatory Visit (INDEPENDENT_AMBULATORY_CARE_PROVIDER_SITE_OTHER): Payer: Self-pay | Admitting: Vascular Surgery

## 2013-02-22 ENCOUNTER — Encounter: Payer: Self-pay | Admitting: Vascular Surgery

## 2013-02-22 VITALS — BP 109/50 | HR 58 | Temp 98.2°F | Resp 58 | Ht 63.0 in | Wt 157.0 lb

## 2013-02-22 DIAGNOSIS — L539 Erythematous condition, unspecified: Secondary | ICD-10-CM

## 2013-02-22 DIAGNOSIS — R6889 Other general symptoms and signs: Secondary | ICD-10-CM

## 2013-02-22 DIAGNOSIS — R52 Pain, unspecified: Secondary | ICD-10-CM

## 2013-02-22 DIAGNOSIS — Z48812 Encounter for surgical aftercare following surgery on the circulatory system: Secondary | ICD-10-CM

## 2013-02-22 DIAGNOSIS — I739 Peripheral vascular disease, unspecified: Secondary | ICD-10-CM

## 2013-02-22 NOTE — Progress Notes (Signed)
This is a 68 year old female who is status post right femoral to above-knee popliteal bypass with propaten on December 9. She returns today for further followup. She states she has a small amount drainage and redness at her above-knee incision. She states her claudication symptoms have completely resolved. She does still have some swelling in her right leg.    Physical exam:  Filed Vitals:   02/22/13 1348  BP: 109/50  Pulse: 58  Temp: 98.2 F (36.8 C)  TempSrc: Oral  Resp: 58  Height: 5\' 3"  (1.6 m)  Weight: 157 lb (71.215 kg)  SpO2: 100%    Right lower extremity: Well-healed groin incisions right above-knee incision healing well except for inferior aspect slight erythema and a suture extruding from this. This was trimmed back today. Most likely the erythema was inflammation from this suture. No abscess no fluctuance no drainage, 2+ DP pulse right leg  Lower extremity: 2+ DP pulse  Assessment:  Doing well status post right femoral to above-knee popliteal bypass with propaten.  Plan: Patient was given a prescription today for compression stocking to deal with swelling in the right leg. She will followup in March with a duplex and ABIs at that time.  Fabienne Brunsharles Madelena Maturin, MD [facility] [place] Office: [phone] Pager: [phone]

## 2013-02-27 ENCOUNTER — Ambulatory Visit: Payer: Medicare Other | Admitting: Pharmacist

## 2013-03-01 ENCOUNTER — Encounter: Payer: Medicare Other | Admitting: Vascular Surgery

## 2013-05-02 ENCOUNTER — Encounter: Payer: Self-pay | Admitting: Family

## 2013-05-03 ENCOUNTER — Ambulatory Visit (HOSPITAL_COMMUNITY)
Admission: RE | Admit: 2013-05-03 | Discharge: 2013-05-03 | Disposition: A | Discharge status: Home / Self Care | Payer: Medicare Other | Source: Ambulatory Visit | Attending: Vascular Surgery | Admitting: Vascular Surgery

## 2013-05-03 ENCOUNTER — Ambulatory Visit (INDEPENDENT_AMBULATORY_CARE_PROVIDER_SITE_OTHER)
Admission: RE | Admit: 2013-05-03 | Discharge: 2013-05-03 | Disposition: A | Discharge status: Home / Self Care | Payer: Medicare Other | Source: Ambulatory Visit | Attending: Vascular Surgery | Admitting: Vascular Surgery

## 2013-05-03 ENCOUNTER — Encounter: Payer: Self-pay | Admitting: Family

## 2013-05-03 ENCOUNTER — Ambulatory Visit (INDEPENDENT_AMBULATORY_CARE_PROVIDER_SITE_OTHER): Payer: Medicare Other | Admitting: Family

## 2013-05-03 VITALS — BP 113/59 | HR 47 | Resp 18 | Ht 63.0 in | Wt 168.1 lb

## 2013-05-03 DIAGNOSIS — I739 Peripheral vascular disease, unspecified: Secondary | ICD-10-CM | POA: Insufficient documentation

## 2013-05-03 DIAGNOSIS — Z48812 Encounter for surgical aftercare following surgery on the circulatory system: Secondary | ICD-10-CM

## 2013-05-03 NOTE — Progress Notes (Signed)
[facility] [place] HISTORY AND PHYSICAL -PAD  History of Present Illness Denise CroftsKaren S [name] is a 68 y.o. female patient of Dr. Darrick PennaFields who is status post right femoral to above-knee popliteal bypass with propaten on January 23, 2013. She returns today for further followup.  She states her claudication symptoms have completely resolved, denies any non healing wounds. She had an MI 18 years ago, had a TIA about this time also as manifested by numbness/weakness in left arm and leg, saw a neurologist, left arm has some mild residual weakness.   She wears her knee high compression hose.  The patient denies New Medical or Surgical History.  Pt Diabetic: No Pt smoker: smoker  (3/4 ppd x since age 68 yrs)  Pt meds include: Statin :No, has severe myalgias with taking statins ASA: Yes Other anticoagulants/antiplatelets: no  Past Medical History  Diagnosis Date  . GERD (gastroesophageal reflux disease)   . Hyperlipidemia   . Hypertension   . Coronary artery disease 09/09/2011    stress - non-gated; nod/severe perfusion defect in basal/mid inferior, basa/mid inferolateral and apical lateral regions consistient w. scar/infarct; 2009 stress test EF 53% mild/mod perfusion defect  . Shortness of breath   . Stroke     many years ago, "light stroke"  . Weakness of left leg 01/20/2004    left arm/leg both weak, normal echo, no signs emboli  . Cerebral atherosclerosis 02/19/2010    doppler -L ICA mid 0-49% reduction  . Bradycardia, drug induced     resolved w/ d/c bystolic  . Gall bladder disease   . Heart attack ~ 17 years ago  . COPD (chronic obstructive pulmonary disease)   . PAF (paroxysmal atrial fibrillation)     post operative in 2009, no recurrences    Social History History  Substance Use Topics  . Smoking status: Current Every Day Smoker -- 0.50 packs/day for 50 years    Types: Cigarettes  . Smokeless tobacco: Never Used  . Alcohol Use: Yes     Comment: occasional  reports maybe 5 drinks a year    Family History Family History  Problem Relation Age of Onset  . Heart failure Mother   . Heart disease Mother   . Hyperlipidemia Mother   . Cancer Father     bone    Past Surgical History  Procedure Laterality Date  . Abdominal hysterectomy  1978, 1982    partial, then complete  . Coronary artery bypass graft  01/2008    x4; LIMA to LAD, SVG to diagonal, SVG to circumflex marginal, SVG to RCA with EVH of R lower extremity   . Cholecystectomy  10/14/2011    Procedure: LAPAROSCOPIC CHOLECYSTECTOMY WITH INTRAOPERATIVE CHOLANGIOGRAM;  Surgeon: Robyne AskewPaul S Toth III, MD;  [facility];  Service: General;  Laterality: N/A;  . Tee without cardioversion  02/07/2008    used intraoperatively w/ CABG to assess LV function,   . Cardiac catheterization  01/30/2008    severe 3 vessel disease, needing CABG, EF 40-45%  . Femoral-popliteal bypass graft Right 01/23/2013    Procedure: BYPASS GRAFT FEMORALTO ABOVE KNEE POPLITEAL ARTERY;  Surgeon: Sherren Kernsharles E Fields, MD;  [facility];  Service: Vascular;  Laterality: Right;    Allergies  Allergen Reactions  . Beta Adrenergic Blockers Other (See Comments)    Significant bradycardia  . Codeine Nausea Only    dizziness  . Statins Other (See Comments)    Muscle aches w/ statins    Current Outpatient  Prescriptions  Medication Sig Dispense Refill  . ADVAIR DISKUS 250-50 MCG/DOSE AEPB Prn      . amLODipine (NORVASC) 5 MG tablet Take 5 mg by mouth daily.       Marland Kitchen aspirin 81 MG tablet Take 81 mg by mouth daily.      . cetirizine (ZYRTEC) 10 MG tablet Take 10 mg by mouth daily.      . citalopram (CELEXA) 40 MG tablet Take 40 mg by mouth daily.       Marland Kitchen LORazepam (ATIVAN) 0.5 MG tablet Take 0.5 mg by mouth every 8 (eight) hours as needed for anxiety.       Marland Kitchen MICARDIS HCT 40-12.5 MG per tablet Take 1 tablet by mouth daily.       . Omega-3 Fatty Acids (FISH OIL) 1000 MG CAPS Take 1 capsule by mouth 2 (two) times daily.        Marland Kitchen omeprazole (PRILOSEC) 20 MG capsule Take 20 mg by mouth daily.       Marland Kitchen ZETIA 10 MG tablet Take 1 tablet by mouth 3 (three) times a week. Tuesday, Thursday, Sunday.      . CHANTIX 0.5 MG tablet       . oxyCODONE (ROXICODONE) 5 MG immediate release tablet Take 1 tablet (5 mg total) by mouth every 6 (six) hours as needed for severe pain.  30 tablet  0   No current facility-administered medications for this visit.    ROS: See HPI for pertinent positives and negatives.   Physical Examination  Filed Vitals:   05/03/13 1340  BP: 113/59  Pulse: 47  Resp: 18   Filed Weights   05/03/13 1340  Weight: 168 lb 1.6 oz (76.25 kg)   Body mass index is 29.79 kg/(m^2).  General: A&O x 3, WDWN. Gait: normal Eyes: PERRLA. Pulmonary: CTAB, without wheezes , rales or rhonchi. Cardiac: regular Rythm , without detected murmur.         Carotid Bruits Left Right   Negative Negative  Aorta is notlpable. Radial pulses: radial and ulnar absent left, faint left brachial pulse. Right radial pulse 2+.  VASCULAR EXAM: Extremities without ischemic changes  without Gangrene; without open wounds.                                                                                                          LE Pulses LEFT RIGHT       FEMORAL   palpable  not pable        POPLITEAL  not palpable   not palpable       POSTERIOR TIBIAL  not palpable   not palpable        DORSALIS PEDIS      ANTERIOR TIBIAL  palpable   palpable    Abdomen: soft, NT, no masses. Skin: no rashes, no ulcers noted. Musculoskeletal: no muscle wasting or atrophy.  Neurologic: A&O X 3; Appropriate Affect ; SENSATION: normal; MOTOR FUNCTION:  moving all extremities equally, motor strength 5/5 throughout. Speech is fluent/normal. CN 2-12 intact.    Non-Invasive  Vascular Imaging: DATE: 05/03/2013 LOWER EXTREMITY ARTERIAL DUPLEX EVALUATION    INDICATION: Follow up bypass graft.    PREVIOUS INTERVENTION(S): Right femoral to  above knee bypass graft by Dr. Darrick Penna 01/23/2013    DUPLEX EXAM: Bypass duplex    RIGHT  LEFT   Peak Systolic Velocity (cm/s) Ratio (if abnormal) Waveform  Peak Systolic Velocity (cm/s) Ratio (if abnormal) Waveform  200  T Inflow Artery     146  T Proximal Anastomosis     104  T Proximal Graft     106  T Mid Graft     85  T  Distal Graft     115  T Distal Anastomosis     123  T Outflow Artery     1.27 Today's ABI / TBI 1.28  0.67 Previous ABI / TBI ( 12/07/2012 ) 1.02    Waveform:    M - Monophasic       B - Biphasic       T - Triphasic  If Ankle Brachial Index (ABI) or Toe Brachial Index (TBI) performed, please see complete report     ADDITIONAL FINDINGS:     IMPRESSION: Patent right femoral to popliteal bypass graft.    Compared to the previous exam:  No prior exams.     ASSESSMENT: Denise [name] is a 68 y.o. female who is status post right femoral to above-knee popliteal bypass with propaten on January 23, 2013. Since this revascularization procedure, she no longer has claudication symptoms, her right ABI is now normal (had evidence of moderate arterial occlusive disease prior to procedure), her left ABI remains normal. The right femoral to popliteal bypass graft is patent.  Unfortunately she continues to smoke, she was counseled re this.  PLAN:  Patient was counseled re smoking cessation.  I discussed in depth with the patient the nature of atherosclerosis, and emphasized the importance of maximal medical management including strict control of blood pressure, blood glucose, and lipid levels, obtaining regular exercise, and cessation of smoking.  The patient is aware that without maximal medical management the underlying atherosclerotic disease process will progress, limiting the benefit of any interventions.  Based on the patient's vascular studies and examination, pt will return to clinic in 3 months for ABI's and right LE arterial Duplex.  The patient was given  information about PAD including signs, symptoms, treatment, what symptoms should prompt the patient to seek immediate medical care, and risk reduction measures to take.  Charisse March, RN, MSN, FNP-C [facility] [place] Phone: [phone]  Clinic MD: Darrick Penna  05/03/2013 1:54 PM

## 2013-05-03 NOTE — Patient Instructions (Addendum)
Peripheral Vascular Disease Peripheral Vascular Disease (PVD), also called Peripheral Arterial Disease (PAD), is a circulation problem caused by cholesterol (atherosclerotic plaque) deposits in the arteries. PVD commonly occurs in the lower extremities (legs) but it can occur in other areas of the body, such as your arms. The cholesterol buildup in the arteries reduces blood flow which can cause pain and other serious problems. The presence of PVD can place a person at risk for Coronary Artery Disease (CAD).  CAUSES  Causes of PVD can be many. It is usually associated with more than one risk factor such as:   High Cholesterol.  Smoking.  Diabetes.  Lack of exercise or inactivity.  High blood pressure (hypertension).  Obesity.  Family history. SYMPTOMS   When the lower extremities are affected, patients with PVD may experience:  Leg pain with exertion or physical activity. This is called INTERMITTENT CLAUDICATION. This may present as cramping or numbness with physical activity. The location of the pain is associated with the level of blockage. For example, blockage at the abdominal level (distal abdominal aorta) may result in buttock or hip pain. Lower leg arterial blockage may result in calf pain.  As PVD becomes more severe, pain can develop with less physical activity.  In people with severe PVD, leg pain may occur at rest.  Other PVD signs and symptoms:  Leg numbness or weakness.  Coldness in the affected leg or foot, especially when compared to the other leg.  A change in leg color.  Patients with significant PVD are more prone to ulcers or sores on toes, feet or legs. These may take longer to heal or may reoccur. The ulcers or sores can become infected.  If signs and symptoms of PVD are ignored, gangrene may occur. This can result in the loss of toes or loss of an entire limb.  Not all leg pain is related to PVD. Other medical conditions can cause leg pain such  as:  Blood clots (embolism) or Deep Vein Thrombosis.  Inflammation of the blood vessels (vasculitis).  Spinal stenosis. DIAGNOSIS  Diagnosis of PVD can involve several different types of tests. These can include:  Pulse Volume Recording Method (PVR). This test is simple, painless and does not involve the use of X-rays. PVR involves measuring and comparing the blood pressure in the arms and legs. An ABI (Ankle-Brachial Index) is calculated. The normal ratio of blood pressures is 1. As this number becomes smaller, it indicates more severe disease.  < 0.95  indicates significant narrowing in one or more leg vessels.  <0.8 there will usually be pain in the foot, leg or buttock with exercise.  <0.4 will usually have pain in the legs at rest.  <0.25  usually indicates limb threatening PVD.  Doppler detection of pulses in the legs. This test is painless and checks to see if you have a pulses in your legs/feet.  A dye or contrast material (a substance that highlights the blood vessels so they show up on x-ray) may be given to help your caregiver better see the arteries for the following tests. The dye is eliminated from your body by the kidney's. Your caregiver may order blood work to check your kidney function and other laboratory values before the following tests are performed:  Magnetic Resonance Angiography (MRA). An MRA is a picture study of the blood vessels and arteries. The MRA machine uses a large magnet to produce images of the blood vessels.  Computed Tomography Angiography (CTA). A CTA is a   specialized x-ray that looks at how the blood flows in your blood vessels. An IV may be inserted into your arm so contrast dye can be injected.  Angiogram. Is a procedure that uses x-rays to look at your blood vessels. This procedure is minimally invasive, meaning a small incision (cut) is made in your groin. A small tube (catheter) is then inserted into the artery of your groin. The catheter is  guided to the blood vessel or artery your caregiver wants to examine. Contrast dye is injected into the catheter. X-rays are then taken of the blood vessel or artery. After the images are obtained, the catheter is taken out. TREATMENT  Treatment of PVD involves many interventions which may include:  Lifestyle changes:  Quitting smoking.  Exercise.  Following a low fat, low cholesterol diet.  Control of diabetes.  Foot care is very important to the PVD patient. Good foot care can help prevent infection.  Medication:  Cholesterol-lowering medicine.  Blood pressure medicine.  Anti-platelet drugs.  Certain medicines may reduce symptoms of Intermittent Claudication.  Interventional/Surgical options:  Angioplasty. An Angioplasty is a procedure that inflates a balloon in the blocked artery. This opens the blocked artery to improve blood flow.  Stent Implant. A wire mesh tube (stent) is placed in the artery. The stent expands and stays in place, allowing the artery to remain open.  Peripheral Bypass Surgery. This is a surgical procedure that reroutes the blood around a blocked artery to help improve blood flow. This type of procedure may be performed if Angioplasty or stent implants are not an option. SEEK IMMEDIATE MEDICAL CARE IF:   You develop pain or numbness in your arms or legs.  Your arm or leg turns cold, becomes blue in color.  You develop redness, warmth, swelling and pain in your arms or legs. MAKE SURE YOU:   Understand these instructions.  Will watch your condition.  Will get help right away if you are not doing well or get worse. Document Released: 03/11/2004 Document Revised: 04/26/2011 Document Reviewed: 02/06/2008 ExitCare Patient Information 2014 [employer].   Smoking Cessation Quitting smoking is important to your health and has many advantages. However, it is not always easy to quit since nicotine is a very addictive drug. Often times, people try 3  times or more before being able to quit. This document explains the best ways for you to prepare to quit smoking. Quitting takes hard work and a lot of effort, but you can do it. ADVANTAGES OF QUITTING SMOKING  You will live longer, feel better, and live better.  Your body will feel the impact of quitting smoking almost immediately.  Within 20 minutes, blood pressure decreases. Your pulse returns to its normal level.  After 8 hours, carbon monoxide levels in the blood return to normal. Your oxygen level increases.  After 24 hours, the chance of having a heart attack starts to decrease. Your breath, hair, and body stop smelling like smoke.  After 48 hours, damaged nerve endings begin to recover. Your sense of taste and smell improve.  After 72 hours, the body is virtually free of nicotine. Your bronchial tubes relax and breathing becomes easier.  After 2 to 12 weeks, lungs can hold more air. Exercise becomes easier and circulation improves.  The risk of having a heart attack, stroke, cancer, or lung disease is greatly reduced.  After 1 year, the risk of coronary heart disease is cut in half.  After 5 years, the risk of stroke falls to   the same as a nonsmoker.  After 10 years, the risk of lung cancer is cut in half and the risk of other cancers decreases significantly.  After 15 years, the risk of coronary heart disease drops, usually to the level of a nonsmoker.  If you are pregnant, quitting smoking will improve your chances of having a healthy baby.  The people you live with, especially any children, will be healthier.  You will have extra money to spend on things other than cigarettes. QUESTIONS TO THINK ABOUT BEFORE ATTEMPTING TO QUIT You may want to talk about your answers with your caregiver.  Why do you want to quit?  If you tried to quit in the past, what helped and what did not?  What will be the most difficult situations for you after you quit? How will you plan to  handle them?  Who can help you through the tough times? Your family? Friends? A caregiver?  What pleasures do you get from smoking? What ways can you still get pleasure if you quit? Here are some questions to ask your caregiver:  How can you help me to be successful at quitting?  What medicine do you think would be best for me and how should I take it?  What should I do if I need more help?  What is smoking withdrawal like? How can I get information on withdrawal? GET READY  Set a quit date.  Change your environment by getting rid of all cigarettes, ashtrays, matches, and lighters in your home, car, or work. Do not let people smoke in your home.  Review your past attempts to quit. Think about what worked and what did not. GET SUPPORT AND ENCOURAGEMENT You have a better chance of being successful if you have help. You can get support in many ways.  Tell your family, friends, and co-workers that you are going to quit and need their support. Ask them not to smoke around you.  Get individual, group, or telephone counseling and support. Programs are available at local hospitals and health centers. Call your local health department for information about programs in your area.  Spiritual beliefs and practices may help some smokers quit.  Download a "quit meter" on your computer to keep track of quit statistics, such as how long you have gone without smoking, cigarettes not smoked, and money saved.  Get a self-help book about quitting smoking and staying off of tobacco. LEARN NEW SKILLS AND BEHAVIORS  Distract yourself from urges to smoke. Talk to someone, go for a walk, or occupy your time with a task.  Change your normal routine. Take a different route to work. Drink tea instead of coffee. Eat breakfast in a different place.  Reduce your stress. Take a hot bath, exercise, or read a book.  Plan something enjoyable to do every day. Reward yourself for not smoking.  Explore  interactive web-based programs that specialize in helping you quit. GET MEDICINE AND USE IT CORRECTLY Medicines can help you stop smoking and decrease the urge to smoke. Combining medicine with the above behavioral methods and support can greatly increase your chances of successfully quitting smoking.  Nicotine replacement therapy helps deliver nicotine to your body without the negative effects and risks of smoking. Nicotine replacement therapy includes nicotine gum, lozenges, inhalers, nasal sprays, and skin patches. Some may be available over-the-counter and others require a prescription.  Antidepressant medicine helps people abstain from smoking, but how this works is unknown. This medicine is available by prescription.    Nicotinic receptor partial agonist medicine simulates the effect of nicotine in your brain. This medicine is available by prescription. Ask your caregiver for advice about which medicines to use and how to use them based on your health history. Your caregiver will tell you what side effects to look out for if you choose to be on a medicine or therapy. Carefully read the information on the package. Do not use any other product containing nicotine while using a nicotine replacement product.  RELAPSE OR DIFFICULT SITUATIONS Most relapses occur within the first 3 months after quitting. Do not be discouraged if you start smoking again. Remember, most people try several times before finally quitting. You may have symptoms of withdrawal because your body is used to nicotine. You may crave cigarettes, be irritable, feel very hungry, cough often, get headaches, or have difficulty concentrating. The withdrawal symptoms are only temporary. They are strongest when you first quit, but they will go away within 10 14 days. To reduce the chances of relapse, try to:  Avoid drinking alcohol. Drinking lowers your chances of successfully quitting.  Reduce the amount of caffeine you consume. Once you  quit smoking, the amount of caffeine in your body increases and can give you symptoms, such as a rapid heartbeat, sweating, and anxiety.  Avoid smokers because they can make you want to smoke.  Do not let weight gain distract you. Many smokers will gain weight when they quit, usually less than 10 pounds. Eat a healthy diet and stay active. You can always lose the weight gained after you quit.  Find ways to improve your mood other than smoking. FOR MORE INFORMATION  www.smokefree.gov  Document Released: 01/26/2001 Document Revised: 08/03/2011 Document Reviewed: 05/13/2011 Exit[employer] Patient Information 2014 [employer].  

## 2013-05-04 ENCOUNTER — Other Ambulatory Visit: Payer: Self-pay | Admitting: Vascular Surgery

## 2013-05-04 DIAGNOSIS — I739 Peripheral vascular disease, unspecified: Secondary | ICD-10-CM

## 2013-05-04 NOTE — Addendum Note (Signed)
Addended by: Adria DillELDRIDGE-LEWIS, Faizan Geraci L on: 05/04/2013 06:08 PM   Modules accepted: Orders

## 2013-06-14 ENCOUNTER — Encounter (HOSPITAL_COMMUNITY): Payer: Medicare Other

## 2013-06-14 ENCOUNTER — Other Ambulatory Visit (HOSPITAL_COMMUNITY): Payer: Medicare Other

## 2013-06-14 ENCOUNTER — Ambulatory Visit: Payer: Medicare Other | Admitting: Vascular Surgery

## 2013-06-27 ENCOUNTER — Encounter: Payer: Self-pay | Admitting: Cardiology

## 2013-06-27 ENCOUNTER — Ambulatory Visit (INDEPENDENT_AMBULATORY_CARE_PROVIDER_SITE_OTHER): Payer: Medicare Other | Admitting: Cardiology

## 2013-06-27 VITALS — BP 100/50 | HR 54 | Ht 63.0 in | Wt 157.0 lb

## 2013-06-27 DIAGNOSIS — I252 Old myocardial infarction: Secondary | ICD-10-CM

## 2013-06-27 DIAGNOSIS — I70219 Atherosclerosis of native arteries of extremities with intermittent claudication, unspecified extremity: Secondary | ICD-10-CM

## 2013-06-27 DIAGNOSIS — I10 Essential (primary) hypertension: Secondary | ICD-10-CM

## 2013-06-27 DIAGNOSIS — E785 Hyperlipidemia, unspecified: Secondary | ICD-10-CM

## 2013-06-27 DIAGNOSIS — I2510 Atherosclerotic heart disease of native coronary artery without angina pectoris: Secondary | ICD-10-CM

## 2013-06-27 DIAGNOSIS — I739 Peripheral vascular disease, unspecified: Secondary | ICD-10-CM

## 2013-06-27 NOTE — Patient Instructions (Signed)
Continue same medications.   Your physician wants you to follow-up in: 6 months.  You will receive a reminder letter in the mail two months in advance. If you don't receive a letter, please call our office to schedule the follow-up appointment.  

## 2013-06-27 NOTE — Progress Notes (Signed)
[address] [place], [place]  [address] Phone: [phone] Fax:  [phone]  Date:  06/27/2013   ID:  Denise [name], DOB [birth year], MRN [id number]  PCP:  Cassell SmilesFUSCO,LAWRENCE J., MD   History of Present Illness: Denise [name] is a 68 y.o. female ([name], brother of my patient Denise [name]) with hypertension, hyperlipidemia, [name], prior stroke, COPD with coronary artery disease status post myocardial infarction and RCA stent in 1998, LAD stent x2 in 2004 then subsequent CABG (LIMA to LAD, SVG to diagonal, SVG to circumflex, SVG to RCA) in 2009 with postoperative paroxysmal atrial fibrillation, peripheral arterial disease seen by Dr. Fabienne Brunsharles Fields status post right femoral to above-knee popliteal bypass on 01/23/13 here for followup. She is a former patient of Dr. Caprice KluverAl Little.  She's no longer having right leg claudication. No chest pain, shortness of breath. She has battled with smoking for over 40 years. She has not taken Chantix.   Wt Readings from Last 3 Encounters:  06/27/13 157 lb (71.215 kg)  05/03/13 168 lb 1.6 oz (76.25 kg)  02/22/13 157 lb (71.215 kg)     Past Medical History  Diagnosis Date  . GERD (gastroesophageal reflux disease)   . Hyperlipidemia   . Hypertension   . Coronary artery disease 09/09/2011    stress - non-gated; nod/severe perfusion defect in basal/mid inferior, basa/mid inferolateral and apical lateral regions consistient w. scar/infarct; 2009 stress test EF 53% mild/mod perfusion defect  . Shortness of breath   . Stroke     many years ago, "light stroke"  . Weakness of left leg 01/20/2004    left arm/leg both weak, normal echo, no signs emboli  . Cerebral atherosclerosis 02/19/2010    doppler -L ICA mid 0-49% reduction  . Bradycardia, drug induced     resolved w/ d/c bystolic  . Gall bladder disease   . Heart attack ~ 17 years ago  . COPD (chronic obstructive pulmonary disease)   . PAF (paroxysmal atrial fibrillation)     post  operative in 2009, no recurrences    Past Surgical History  Procedure Laterality Date  . Abdominal hysterectomy  1978, 1982    partial, then complete  . Coronary artery bypass graft  01/2008    x4; LIMA to LAD, SVG to diagonal, SVG to circumflex marginal, SVG to RCA with EVH of R lower extremity   . Cholecystectomy  10/14/2011    Procedure: LAPAROSCOPIC CHOLECYSTECTOMY WITH INTRAOPERATIVE CHOLANGIOGRAM;  Surgeon: Robyne AskewPaul S Toth III, MD;  [facility];  Service: General;  Laterality: N/A;  . Tee without cardioversion  02/07/2008    used intraoperatively w/ CABG to assess LV function,   . Cardiac catheterization  01/30/2008    severe 3 vessel disease, needing CABG, EF 40-45%  . Femoral-popliteal bypass graft Right 01/23/2013    Procedure: BYPASS GRAFT FEMORALTO ABOVE KNEE POPLITEAL ARTERY;  Surgeon: Sherren Kernsharles E Fields, MD;  [facility];  Service: Vascular;  Laterality: Right;    Current Outpatient Prescriptions  Medication Sig Dispense Refill  . ADVAIR DISKUS 250-50 MCG/DOSE AEPB Inhale 1 puff into the lungs as needed. Prn      . albuterol (PROVENTIL) 2 MG tablet prn      . amLODipine (NORVASC) 5 MG tablet Take 5 mg by mouth daily.       Marland Kitchen. aspirin 81 MG tablet Take 81 mg by mouth daily.      . cetirizine (ZYRTEC) 10 MG tablet Take  10 mg by mouth daily.      . CHANTIX 0.5 MG tablet Take 0.5 mg by mouth daily.       . citalopram (CELEXA) 40 MG tablet Take 40 mg by mouth daily.       . diphenoxylate-atropine (LOMOTIL) 2.5-0.025 MG per tablet Treats diarrhea      . LORazepam (ATIVAN) 0.5 MG tablet Take 0.5 mg by mouth every 8 (eight) hours as needed for anxiety.       Marland Kitchen. MICARDIS HCT 40-12.5 MG per tablet Take 1 tablet by mouth daily.       . Omega-3 Fatty Acids (FISH OIL) 1000 MG CAPS Take 1 capsule by mouth 2 (two) times daily.       Marland Kitchen. omeprazole (PRILOSEC) 20 MG capsule Take 20 mg by mouth daily.       Marland Kitchen. ZETIA 10 MG tablet Take 1 tablet by mouth 3 (three) times a week. Tuesday, Thursday,  Sunday.       No current facility-administered medications for this visit.    Allergies:    Allergies  Allergen Reactions  . Beta Adrenergic Blockers Other (See Comments)    Significant bradycardia  . Codeine Nausea Only    dizziness  . Statins Other (See Comments)    Muscle aches w/ statins    Social History:  The patient  reports that she has been smoking Cigarettes.  She has a 25 pack-year smoking history. She has never used smokeless tobacco. She reports that she drinks alcohol. She reports that she does not use illicit drugs.   Family History  Problem Relation Age of Onset  . Heart failure Mother   . Heart disease Mother   . Hyperlipidemia Mother   . Cancer Father     bone    ROS:  Please see the history of present illness.   Denies any fevers, chills, orthopnea, PND   All other systems reviewed and negative.   PHYSICAL EXAM: VS:  BP 100/50  Pulse 54  Ht 5\' 3"  (1.6 m)  Wt 157 lb (71.215 kg)  BMI 27.82 kg/m2 Well nourished, well developed, in no acute distress HEENT: normal, [place]/AT, EOMI Neck: no JVD, normal carotid upstroke, no bruit Cardiac:  normal S1, S2; RRR; no murmurDistant heart sounds Lungs:  No active wheezing. Normal respiratory effort Abd: soft, nontender, no hepatomegaly, no bruits Ext: no edema, palpable distal pulses Skin: warm and dry GU: deferred Neuro: no focal abnormalities noted, AAO x 3  EKG:   11/15 - NSR, NSSTW changes.   ASSESSMENT AND PLAN:  1. Old MI-status post RCA stent. Bypass. Doing well. 2. Coronary artery disease-no active anginal symptoms. Aggressive secondary prevention. 3. Peripheral vascular disease-reviewed notes from Dr. Darrick Pennafields. Bypass surgery right lower extremity. Aspirin. Tobacco cessation. Exercise. 4. Tobacco use-counseled on tobacco cessation. Encouraged use of the American Heart Association website. 5. Hypertension-very well controlled. 6. Hyperlipidemia-Zetia 3 days a week. She has not been able to tolerate  statins previously. 7. 6 month f/u.  Signed, Donato SchultzMark Skains, MD [facility]  06/27/2013 2:25 PM

## 2013-08-01 ENCOUNTER — Encounter: Payer: Self-pay | Admitting: Family

## 2013-08-02 ENCOUNTER — Ambulatory Visit (INDEPENDENT_AMBULATORY_CARE_PROVIDER_SITE_OTHER): Payer: Medicare Other | Admitting: Family

## 2013-08-02 ENCOUNTER — Ambulatory Visit (INDEPENDENT_AMBULATORY_CARE_PROVIDER_SITE_OTHER)
Admission: RE | Admit: 2013-08-02 | Discharge: 2013-08-02 | Disposition: A | Discharge status: Home / Self Care | Payer: Medicare Other | Source: Ambulatory Visit | Attending: Family | Admitting: Family

## 2013-08-02 ENCOUNTER — Ambulatory Visit (HOSPITAL_COMMUNITY)
Admission: RE | Admit: 2013-08-02 | Discharge: 2013-08-02 | Disposition: A | Discharge status: Home / Self Care | Payer: Medicare Other | Source: Ambulatory Visit | Attending: Family | Admitting: Family

## 2013-08-02 ENCOUNTER — Encounter: Payer: Self-pay | Admitting: Family

## 2013-08-02 VITALS — BP 117/59 | HR 57 | Resp 14 | Ht 63.5 in | Wt 156.0 lb

## 2013-08-02 DIAGNOSIS — I739 Peripheral vascular disease, unspecified: Secondary | ICD-10-CM

## 2013-08-02 DIAGNOSIS — I6529 Occlusion and stenosis of unspecified carotid artery: Secondary | ICD-10-CM

## 2013-08-02 DIAGNOSIS — Z48812 Encounter for surgical aftercare following surgery on the circulatory system: Secondary | ICD-10-CM

## 2013-08-02 NOTE — Progress Notes (Addendum)
[facility] [place] HISTORY AND PHYSICAL -PAD  History of Present Illness Denise CroftsKaren S [name] is a 68 y.o. female patient of Dr. Darrick PennaFields who is status post right femoral to above-knee popliteal bypass with propaten on January 23, 2013. She returns today for further followup.  She states her claudication symptoms have completely resolved, denies any non healing wounds.  She had an MI about 1997, had a TIA about this time also as manifested by numbness/weakness in left arm and leg, saw a neurologist, left arm has some mild residual weakness.  The last carotid artery Duplex on file was performed 02/19/2010, followed by her cardiologists, Dr's. Alanda AmassWeintraub and Allyson SabalBerry; results indicated a small amount of fibrous plaque in the right ICA, 0-49% stenosis in left ICA.  She denies any recent stroke or TIA symptoms.  She wears her knee high compression hose.  The patient denies New Medical or Surgical History.  She walks a great deal, does not use ETOH.  Pt Diabetic: No  Pt smoker: smoker (1/2 ppd, decreased from 3/4 ppd three months ago, x since age 68 yrs)  Pt meds include:  Statin :No, has severe myalgias with taking statins  ASA: Yes  Other anticoagulants/antiplatelets: no   Past Medical History  Diagnosis Date  . GERD (gastroesophageal reflux disease)   . Hyperlipidemia   . Hypertension   . Coronary artery disease 09/09/2011    stress - non-gated; nod/severe perfusion defect in basal/mid inferior, basa/mid inferolateral and apical lateral regions consistient w. scar/infarct; 2009 stress test EF 53% mild/mod perfusion defect  . Shortness of breath   . Stroke     many years ago, "light stroke"  . Weakness of left leg 01/20/2004    left arm/leg both weak, normal echo, no signs emboli  . Cerebral atherosclerosis 02/19/2010    doppler -L ICA mid 0-49% reduction  . Bradycardia, drug induced     resolved w/ d/c bystolic  . Gall bladder disease   . Heart attack ~ 17 years ago  .  COPD (chronic obstructive pulmonary disease)   . PAF (paroxysmal atrial fibrillation)     post operative in 2009, no recurrences    Social History History  Substance Use Topics  . Smoking status: Current Every Day Smoker -- 0.50 packs/day for 50 years    Types: Cigarettes  . Smokeless tobacco: Never Used  . Alcohol Use: Yes     Comment: occasional reports maybe 5 drinks a year    Family History Family History  Problem Relation Age of Onset  . Heart failure Mother   . Heart disease Mother   . Hyperlipidemia Mother   . Cancer Father     bone    Past Surgical History  Procedure Laterality Date  . Abdominal hysterectomy  1978, 1982    partial, then complete  . Coronary artery bypass graft  01/2008    x4; LIMA to LAD, SVG to diagonal, SVG to circumflex marginal, SVG to RCA with EVH of R lower extremity   . Cholecystectomy  10/14/2011    Procedure: LAPAROSCOPIC CHOLECYSTECTOMY WITH INTRAOPERATIVE CHOLANGIOGRAM;  Surgeon: Robyne AskewPaul S Toth III, MD;  [facility];  Service: General;  Laterality: N/A;  . Tee without cardioversion  02/07/2008    used intraoperatively w/ CABG to assess LV function,   . Cardiac catheterization  01/30/2008    severe 3 vessel disease, needing CABG, EF 40-45%  . Femoral-popliteal bypass graft Right 01/23/2013    Procedure: BYPASS GRAFT FEMORALTO ABOVE KNEE  POPLITEAL ARTERY;  Surgeon: Sherren Kernsharles E Fields, MD;  [facility];  Service: Vascular;  Laterality: Right;    Allergies  Allergen Reactions  . Beta Adrenergic Blockers Other (See Comments)    Significant bradycardia  . Codeine Nausea Only    dizziness  . Statins Other (See Comments)    Muscle aches w/ statins    Current Outpatient Prescriptions  Medication Sig Dispense Refill  . ADVAIR DISKUS 250-50 MCG/DOSE AEPB Inhale 1 puff into the lungs as needed. Prn      . albuterol (PROVENTIL) 2 MG tablet prn      . amLODipine (NORVASC) 5 MG tablet Take 5 mg by mouth daily.       Marland Kitchen. aspirin 81 MG tablet  Take 81 mg by mouth daily.      . cetirizine (ZYRTEC) 10 MG tablet Take 10 mg by mouth daily.      . CHANTIX 0.5 MG tablet Take 0.5 mg by mouth daily.       . citalopram (CELEXA) 40 MG tablet Take 40 mg by mouth daily.       . diphenoxylate-atropine (LOMOTIL) 2.5-0.025 MG per tablet Treats diarrhea      . LORazepam (ATIVAN) 0.5 MG tablet Take 0.5 mg by mouth every 8 (eight) hours as needed for anxiety.       Marland Kitchen. MICARDIS HCT 40-12.5 MG per tablet Take 1 tablet by mouth daily.       . Omega-3 Fatty Acids (FISH OIL) 1000 MG CAPS Take 1 capsule by mouth 2 (two) times daily.       Marland Kitchen. omeprazole (PRILOSEC) 20 MG capsule Take 20 mg by mouth daily.       Marland Kitchen. ZETIA 10 MG tablet Take 1 tablet by mouth 3 (three) times a week. Tuesday, Thursday, Sunday.       No current facility-administered medications for this visit.    ROS: See HPI for pertinent positives and negatives.   Physical Examination  Filed Vitals:   08/02/13 1107  BP: 117/59  Pulse: 57  Resp: 14  Height: 5' 3.5" (1.613 m)  Weight: 156 lb (70.761 kg)  SpO2: 98%   Body mass index is 27.2 kg/(m^2).  General: A&O x 3, WDWN.  Gait: normal  Eyes: PERRLA.  Pulmonary: CTAB, without wheezes , rales or rhonchi.  Cardiac: regular Rythm , without detected murmur.   Carotid Bruits  Left  Right    Negative  Negative   Aorta is not palpable.  Radial pulses: radial and ulnar absent left, faint left brachial pulse. Right radial pulse 2+.   VASCULAR EXAM:  Extremities without ischemic changes  without Gangrene; without open wounds.   LE Pulses  LEFT  RIGHT   FEMORAL  palpable  not pable   POPLITEAL  not palpable  not palpable   POSTERIOR TIBIAL  not palpable  not palpable   DORSALIS PEDIS  ANTERIOR TIBIAL  3+palpable  3+palpable    Abdomen: soft, NT, no masses.  Skin: no rashes, no ulcers noted.  Musculoskeletal: no muscle wasting or atrophy.  Neurologic: A&O X 3; Appropriate Affect ; SENSATION: normal; MOTOR FUNCTION: moving all  extremities equally, motor strength 5/5 throughout. Speech is fluent/normal. CN 2-12 intact.    Non-Invasive Vascular Imaging: DATE: 08/02/2013 LOWER EXTREMITY ARTERIAL DUPLEX EVALUATION    INDICATION: Follow up right lower extremity    PREVIOUS INTERVENTION(S): Right femoral to popliteal above knee bypass graft 01/23/2013.    DUPLEX EXAM: Right lower extremity duplex    RIGHT  LEFT   Peak Systolic Velocity (cm/s) Ratio (if abnormal) Waveform  Peak Systolic Velocity (cm/s) Ratio (if abnormal) Waveform  178  T Inflow Artery     199  T Proximal Anastomosis     103  T Proximal Graft     108  T Mid Graft     95  T  Distal Graft     140  T Distal Anastomosis     76  T Outflow Artery     1.21 Today's ABI / TBI 1.22  1.27 Previous ABI / TBI ( 05/03/13 ) 1.28    Waveform:    M - Monophasic       B - Biphasic       T - Triphasic  If Ankle Brachial Index (ABI) or Toe Brachial Index (TBI) performed, please see complete report     ADDITIONAL FINDINGS:     IMPRESSION: Patent right femoral to popliteal bypass graft with no evidence for restenosis.    Compared to the previous exam:  No change.   08/23/2012 Carotid Duplex result found: 1. Bilateral carotid bifurcation and proximal ICA plaque,  resulting in less than 50% diameter stenosis. The exam does not  exclude plaque ulceration or embolization. Continued surveillance  recommended.  2. Origin stenosis of the left external carotid artery, of  questionable clinical significance.  3. Reversal of systolic flow direction in the left vertebral  artery suggesting proximal occlusive disease. Correlate with any  clinical evidence of subclavian steal.    ASSESSMENT: NORALYN KARIM is a 68 y.o. female who is status post right femoral to above-knee popliteal bypass with propaten on January 23, 2013.  Since this revascularization procedure, she no longer has claudication symptoms, her right ABI remains normal (had evidence of moderate arterial  occlusive disease prior to procedure), her left ABI also remains normal.  The right femoral to popliteal bypass graft is patent.  Unfortunately she continues to smoke, she was counseled re this. Fortunately she does not have DM. Will add carotid Duplex when she returns in 3 months since she had a stroke about 1999; July, 2014 carotid Duplex indicates less than 50% stenosis of bilateral ICA's.   PLAN:  She was again counseled re smoking cessation. I discussed in depth with the patient the nature of atherosclerosis, and emphasized the importance of maximal medical management including strict control of blood pressure, blood glucose, and lipid levels, obtaining regular exercise, and cessation of smoking.  The patient is aware that without maximal medical management the underlying atherosclerotic disease process will progress, limiting the benefit of any interventions.  Based on the patient's vascular studies and examination, pt will return to clinic in 3 months for ABI's and right LE arterial Duplex, according to surveillance timeline: every 3 months for the first year post operatively, every 6 months for the second year, yearly thereafter if PAD remains stable or improves.   The patient was given information about PAD including signs, symptoms, treatment, what symptoms should prompt the patient to seek immediate medical care, and risk reduction measures to take.  Charisse March, RN, MSN, FNP-C [facility] Phone: [phone]  Clinic MD: Early  08/02/2013 10:55 AM

## 2013-08-02 NOTE — Patient Instructions (Signed)
Peripheral Vascular Disease Peripheral Vascular Disease (PVD), also called Peripheral Arterial Disease (PAD), is a circulation problem caused by cholesterol (atherosclerotic plaque) deposits in the arteries. PVD commonly occurs in the lower extremities (legs) but it can occur in other areas of the body, such as your arms. The cholesterol buildup in the arteries reduces blood flow which can cause pain and other serious problems. The presence of PVD can place a person at risk for Coronary Artery Disease (CAD).  CAUSES  Causes of PVD can be many. It is usually associated with more than one risk factor such as:   High Cholesterol.  Smoking.  Diabetes.  Lack of exercise or inactivity.  High blood pressure (hypertension).  Obesity.  Family history. SYMPTOMS   When the lower extremities are affected, patients with PVD may experience:  Leg pain with exertion or physical activity. This is called INTERMITTENT CLAUDICATION. This may present as cramping or numbness with physical activity. The location of the pain is associated with the level of blockage. For example, blockage at the abdominal level (distal abdominal aorta) may result in buttock or hip pain. Lower leg arterial blockage may result in calf pain.  As PVD becomes more severe, pain can develop with less physical activity.  In people with severe PVD, leg pain may occur at rest.  Other PVD signs and symptoms:  Leg numbness or weakness.  Coldness in the affected leg or foot, especially when compared to the other leg.  A change in leg color.  Patients with significant PVD are more prone to ulcers or sores on toes, feet or legs. These may take longer to heal or may reoccur. The ulcers or sores can become infected.  If signs and symptoms of PVD are ignored, gangrene may occur. This can result in the loss of toes or loss of an entire limb.  Not all leg pain is related to PVD. Other medical conditions can cause leg pain such  as:  Blood clots (embolism) or Deep Vein Thrombosis.  Inflammation of the blood vessels (vasculitis).  Spinal stenosis. DIAGNOSIS  Diagnosis of PVD can involve several different types of tests. These can include:  Pulse Volume Recording Method (PVR). This test is simple, painless and does not involve the use of X-rays. PVR involves measuring and comparing the blood pressure in the arms and legs. An ABI (Ankle-Brachial Index) is calculated. The normal ratio of blood pressures is 1. As this number becomes smaller, it indicates more severe disease.  < 0.95 - indicates significant narrowing in one or more leg vessels.  <0.8 - there will usually be pain in the foot, leg or buttock with exercise.  <0.4 - will usually have pain in the legs at rest.  <0.25 - usually indicates limb threatening PVD.  Doppler detection of pulses in the legs. This test is painless and checks to see if you have a pulses in your legs/feet.  A dye or contrast material (a substance that highlights the blood vessels so they show up on x-ray) may be given to help your caregiver better see the arteries for the following tests. The dye is eliminated from your body by the kidney's. Your caregiver may order blood work to check your kidney function and other laboratory values before the following tests are performed:  Magnetic Resonance Angiography (MRA). An MRA is a picture study of the blood vessels and arteries. The MRA machine uses a large magnet to produce images of the blood vessels.  Computed Tomography Angiography (CTA). A CTA   is a specialized x-ray that looks at how the blood flows in your blood vessels. An IV may be inserted into your arm so contrast dye can be injected.  Angiogram. Is a procedure that uses x-rays to look at your blood vessels. This procedure is minimally invasive, meaning a small incision (cut) is made in your groin. A small tube (catheter) is then inserted into the artery of your groin. The catheter  is guided to the blood vessel or artery your caregiver wants to examine. Contrast dye is injected into the catheter. X-rays are then taken of the blood vessel or artery. After the images are obtained, the catheter is taken out. TREATMENT  Treatment of PVD involves many interventions which may include:  Lifestyle changes:  Quitting smoking.  Exercise.  Following a low fat, low cholesterol diet.  Control of diabetes.  Foot care is very important to the PVD patient. Good foot care can help prevent infection.  Medication:  Cholesterol-lowering medicine.  Blood pressure medicine.  Anti-platelet drugs.  Certain medicines may reduce symptoms of Intermittent Claudication.  Interventional/Surgical options:  Angioplasty. An Angioplasty is a procedure that inflates a balloon in the blocked artery. This opens the blocked artery to improve blood flow.  Stent Implant. A wire mesh tube (stent) is placed in the artery. The stent expands and stays in place, allowing the artery to remain open.  Peripheral Bypass Surgery. This is a surgical procedure that reroutes the blood around a blocked artery to help improve blood flow. This type of procedure may be performed if Angioplasty or stent implants are not an option. SEEK IMMEDIATE MEDICAL CARE IF:   You develop pain or numbness in your arms or legs.  Your arm or leg turns cold, becomes blue in color.  You develop redness, warmth, swelling and pain in your arms or legs. MAKE SURE YOU:   Understand these instructions.  Will watch your condition.  Will get help right away if you are not doing well or get worse. Document Released: 03/11/2004 Document Revised: 04/26/2011 Document Reviewed: 02/06/2008 ExitCare Patient Information 2015 [employer]. This information is not intended to replace advice given to you by your health care provider. Make sure you discuss any questions you have with your health care provider.  Smoking  Cessation Quitting smoking is important to your health and has many advantages. However, it is not always easy to quit since nicotine is a very addictive drug. Often times, people try 3 times or more before being able to quit. This document explains the best ways for you to prepare to quit smoking. Quitting takes hard work and a lot of effort, but you can do it. ADVANTAGES OF QUITTING SMOKING  You will live longer, feel better, and live better.  Your body will feel the impact of quitting smoking almost immediately.  Within 20 minutes, blood pressure decreases. Your pulse returns to its normal level.  After 8 hours, carbon monoxide levels in the blood return to normal. Your oxygen level increases.  After 24 hours, the chance of having a heart attack starts to decrease. Your breath, hair, and body stop smelling like smoke.  After 48 hours, damaged nerve endings begin to recover. Your sense of taste and smell improve.  After 72 hours, the body is virtually free of nicotine. Your bronchial tubes relax and breathing becomes easier.  After 2 to 12 weeks, lungs can hold more air. Exercise becomes easier and circulation improves.  The risk of having a heart attack, stroke, cancer,   or lung disease is greatly reduced.  After 1 year, the risk of coronary heart disease is cut in half.  After 5 years, the risk of stroke falls to the same as a nonsmoker.  After 10 years, the risk of lung cancer is cut in half and the risk of other cancers decreases significantly.  After 15 years, the risk of coronary heart disease drops, usually to the level of a nonsmoker.  If you are pregnant, quitting smoking will improve your chances of having a healthy baby.  The people you live with, especially any children, will be healthier.  You will have extra money to spend on things other than cigarettes. QUESTIONS TO THINK ABOUT BEFORE ATTEMPTING TO QUIT You may want to talk about your answers with your  caregiver.  Why do you want to quit?  If you tried to quit in the past, what helped and what did not?  What will be the most difficult situations for you after you quit? How will you plan to handle them?  Who can help you through the tough times? Your family? Friends? A caregiver?  What pleasures do you get from smoking? What ways can you still get pleasure if you quit? Here are some questions to ask your caregiver:  How can you help me to be successful at quitting?  What medicine do you think would be best for me and how should I take it?  What should I do if I need more help?  What is smoking withdrawal like? How can I get information on withdrawal? GET READY  Set a quit date.  Change your environment by getting rid of all cigarettes, ashtrays, matches, and lighters in your home, car, or work. Do not let people smoke in your home.  Review your past attempts to quit. Think about what worked and what did not. GET SUPPORT AND ENCOURAGEMENT You have a better chance of being successful if you have help. You can get support in many ways.  Tell your family, friends, and co-workers that you are going to quit and need their support. Ask them not to smoke around you.  Get individual, group, or telephone counseling and support. Programs are available at local hospitals and health centers. Call your local health department for information about programs in your area.  Spiritual beliefs and practices may help some smokers quit.  Download a "quit meter" on your computer to keep track of quit statistics, such as how long you have gone without smoking, cigarettes not smoked, and money saved.  Get a self-help book about quitting smoking and staying off of tobacco. LEARN NEW SKILLS AND BEHAVIORS  Distract yourself from urges to smoke. Talk to someone, go for a walk, or occupy your time with a task.  Change your normal routine. Take a different route to work. Drink tea instead of coffee.  Eat breakfast in a different place.  Reduce your stress. Take a hot bath, exercise, or read a book.  Plan something enjoyable to do every day. Reward yourself for not smoking.  Explore interactive web-based programs that specialize in helping you quit. GET MEDICINE AND USE IT CORRECTLY Medicines can help you stop smoking and decrease the urge to smoke. Combining medicine with the above behavioral methods and support can greatly increase your chances of successfully quitting smoking.  Nicotine replacement therapy helps deliver nicotine to your body without the negative effects and risks of smoking. Nicotine replacement therapy includes nicotine gum, lozenges, inhalers, nasal sprays, and skin patches.   Some may be available over-the-counter and others require a prescription.  Antidepressant medicine helps people abstain from smoking, but how this works is unknown. This medicine is available by prescription.  Nicotinic receptor partial agonist medicine simulates the effect of nicotine in your brain. This medicine is available by prescription. Ask your caregiver for advice about which medicines to use and how to use them based on your health history. Your caregiver will tell you what side effects to look out for if you choose to be on a medicine or therapy. Carefully read the information on the package. Do not use any other product containing nicotine while using a nicotine replacement product.  RELAPSE OR DIFFICULT SITUATIONS Most relapses occur within the first 3 months after quitting. Do not be discouraged if you start smoking again. Remember, most people try several times before finally quitting. You may have symptoms of withdrawal because your body is used to nicotine. You may crave cigarettes, be irritable, feel very hungry, cough often, get headaches, or have difficulty concentrating. The withdrawal symptoms are only temporary. They are strongest when you first quit, but they will go away within  10-14 days. To reduce the chances of relapse, try to:  Avoid drinking alcohol. Drinking lowers your chances of successfully quitting.  Reduce the amount of caffeine you consume. Once you quit smoking, the amount of caffeine in your body increases and can give you symptoms, such as a rapid heartbeat, sweating, and anxiety.  Avoid smokers because they can make you want to smoke.  Do not let weight gain distract you. Many smokers will gain weight when they quit, usually less than 10 pounds. Eat a healthy diet and stay active. You can always lose the weight gained after you quit.  Find ways to improve your mood other than smoking. FOR MORE INFORMATION  www.smokefree.gov  Document Released: 01/26/2001 Document Revised: 08/03/2011 Document Reviewed: 05/13/2011 ExitCare Patient Information 2015 [employer]. This information is not intended to replace advice given to you by your health care provider. Make sure you discuss any questions you have with your health care provider.  

## 2013-08-02 NOTE — Addendum Note (Signed)
Addended by: Lorin MercyMCCHESNEY, MARILYN K on: 08/02/2013 01:00 PM   Modules accepted: Orders

## 2013-08-24 ENCOUNTER — Encounter (INDEPENDENT_AMBULATORY_CARE_PROVIDER_SITE_OTHER): Payer: Self-pay | Admitting: *Deleted

## 2013-09-20 ENCOUNTER — Other Ambulatory Visit: Payer: Self-pay | Admitting: Internal Medicine

## 2013-09-24 ENCOUNTER — Telehealth: Payer: Self-pay | Admitting: *Deleted

## 2013-09-24 MED ORDER — OMEPRAZOLE 20 MG PO CPDR: 20.0000 mg | DELAYED_RELEASE_CAPSULE | Freq: Every day | ORAL | Status: AC

## 2013-09-24 NOTE — Telephone Encounter (Signed)
Pam, is it ok refill omeprazole for pt?

## 2013-09-24 NOTE — Telephone Encounter (Signed)
Does Dr Anne FuSkains refill omeprazole for this patient? Please advise. Thanks, MI

## 2013-09-27 ENCOUNTER — Other Ambulatory Visit: Payer: Self-pay

## 2013-09-27 DIAGNOSIS — Z1231 Encounter for screening mammogram for malignant neoplasm of breast: Secondary | ICD-10-CM

## 2013-10-19 ENCOUNTER — Ambulatory Visit
Admission: RE | Admit: 2013-10-19 | Discharge: 2013-10-19 | Disposition: A | Discharge status: Home / Self Care | Payer: Medicare Other | Source: Ambulatory Visit

## 2013-10-19 DIAGNOSIS — Z1231 Encounter for screening mammogram for malignant neoplasm of breast: Secondary | ICD-10-CM

## 2013-11-07 ENCOUNTER — Encounter: Payer: Self-pay | Admitting: Family

## 2013-11-08 ENCOUNTER — Ambulatory Visit (HOSPITAL_COMMUNITY)
Admission: RE | Admit: 2013-11-08 | Discharge: 2013-11-08 | Disposition: A | Discharge status: Home / Self Care | Payer: Medicare Other | Source: Ambulatory Visit | Attending: Family | Admitting: Family

## 2013-11-08 ENCOUNTER — Ambulatory Visit (INDEPENDENT_AMBULATORY_CARE_PROVIDER_SITE_OTHER)
Admission: RE | Admit: 2013-11-08 | Discharge: 2013-11-08 | Disposition: A | Discharge status: Home / Self Care | Payer: Medicare Other | Source: Ambulatory Visit | Attending: Family | Admitting: Family

## 2013-11-08 ENCOUNTER — Encounter: Payer: Self-pay | Admitting: Family

## 2013-11-08 ENCOUNTER — Ambulatory Visit: Payer: Medicare Other | Admitting: Family

## 2013-11-08 ENCOUNTER — Other Ambulatory Visit (HOSPITAL_COMMUNITY): Payer: Medicare Other

## 2013-11-08 ENCOUNTER — Ambulatory Visit (INDEPENDENT_AMBULATORY_CARE_PROVIDER_SITE_OTHER): Payer: Medicare Other | Admitting: Family

## 2013-11-08 VITALS — BP 104/63 | HR 48 | Resp 14 | Ht 63.5 in | Wt 153.0 lb

## 2013-11-08 DIAGNOSIS — I6529 Occlusion and stenosis of unspecified carotid artery: Secondary | ICD-10-CM

## 2013-11-08 DIAGNOSIS — I739 Peripheral vascular disease, unspecified: Secondary | ICD-10-CM | POA: Insufficient documentation

## 2013-11-08 DIAGNOSIS — Z48812 Encounter for surgical aftercare following surgery on the circulatory system: Secondary | ICD-10-CM

## 2013-11-08 NOTE — Addendum Note (Signed)
Addended by: Sharee Pimple on: 11/08/2013 03:32 PM   Modules accepted: Orders

## 2013-11-08 NOTE — Patient Instructions (Signed)
Peripheral Vascular Disease Peripheral Vascular Disease (PVD), also called Peripheral Arterial Disease (PAD), is a circulation problem caused by cholesterol (atherosclerotic plaque) deposits in the arteries. PVD commonly occurs in the lower extremities (legs) but it can occur in other areas of the body, such as your arms. The cholesterol buildup in the arteries reduces blood flow which can cause pain and other serious problems. The presence of PVD can place a person at risk for Coronary Artery Disease (CAD).  CAUSES  Causes of PVD can be many. It is usually associated with more than one risk factor such as:   High Cholesterol.  Smoking.  Diabetes.  Lack of exercise or inactivity.  High blood pressure (hypertension).  Obesity.  Family history. SYMPTOMS   When the lower extremities are affected, patients with PVD may experience:  Leg pain with exertion or physical activity. This is called INTERMITTENT CLAUDICATION. This may present as cramping or numbness with physical activity. The location of the pain is associated with the level of blockage. For example, blockage at the abdominal level (distal abdominal aorta) may result in buttock or hip pain. Lower leg arterial blockage may result in calf pain.  As PVD becomes more severe, pain can develop with less physical activity.  In people with severe PVD, leg pain may occur at rest.  Other PVD signs and symptoms:  Leg numbness or weakness.  Coldness in the affected leg or foot, especially when compared to the other leg.  A change in leg color.  Patients with significant PVD are more prone to ulcers or sores on toes, feet or legs. These may take longer to heal or may reoccur. The ulcers or sores can become infected.  If signs and symptoms of PVD are ignored, gangrene may occur. This can result in the loss of toes or loss of an entire limb.  Not all leg pain is related to PVD. Other medical conditions can cause leg pain such  as:  Blood clots (embolism) or Deep Vein Thrombosis.  Inflammation of the blood vessels (vasculitis).  Spinal stenosis. DIAGNOSIS  Diagnosis of PVD can involve several different types of tests. These can include:  Pulse Volume Recording Method (PVR). This test is simple, painless and does not involve the use of X-rays. PVR involves measuring and comparing the blood pressure in the arms and legs. An ABI (Ankle-Brachial Index) is calculated. The normal ratio of blood pressures is 1. As this number becomes smaller, it indicates more severe disease.  < 0.95 - indicates significant narrowing in one or more leg vessels.  <0.8 - there will usually be pain in the foot, leg or buttock with exercise.  <0.4 - will usually have pain in the legs at rest.  <0.25 - usually indicates limb threatening PVD.  Doppler detection of pulses in the legs. This test is painless and checks to see if you have a pulses in your legs/feet.  A dye or contrast material (a substance that highlights the blood vessels so they show up on x-ray) may be given to help your caregiver better see the arteries for the following tests. The dye is eliminated from your body by the kidney's. Your caregiver may order blood work to check your kidney function and other laboratory values before the following tests are performed:  Magnetic Resonance Angiography (MRA). An MRA is a picture study of the blood vessels and arteries. The MRA machine uses a large magnet to produce images of the blood vessels.  Computed Tomography Angiography (CTA). A CTA   is a specialized x-ray that looks at how the blood flows in your blood vessels. An IV may be inserted into your arm so contrast dye can be injected.  Angiogram. Is a procedure that uses x-rays to look at your blood vessels. This procedure is minimally invasive, meaning a small incision (cut) is made in your groin. A small tube (catheter) is then inserted into the artery of your groin. The catheter  is guided to the blood vessel or artery your caregiver wants to examine. Contrast dye is injected into the catheter. X-rays are then taken of the blood vessel or artery. After the images are obtained, the catheter is taken out. TREATMENT  Treatment of PVD involves many interventions which may include:  Lifestyle changes:  Quitting smoking.  Exercise.  Following a low fat, low cholesterol diet.  Control of diabetes.  Foot care is very important to the PVD patient. Good foot care can help prevent infection.  Medication:  Cholesterol-lowering medicine.  Blood pressure medicine.  Anti-platelet drugs.  Certain medicines may reduce symptoms of Intermittent Claudication.  Interventional/Surgical options:  Angioplasty. An Angioplasty is a procedure that inflates a balloon in the blocked artery. This opens the blocked artery to improve blood flow.  Stent Implant. A wire mesh tube (stent) is placed in the artery. The stent expands and stays in place, allowing the artery to remain open.  Peripheral Bypass Surgery. This is a surgical procedure that reroutes the blood around a blocked artery to help improve blood flow. This type of procedure may be performed if Angioplasty or stent implants are not an option. SEEK IMMEDIATE MEDICAL CARE IF:   You develop pain or numbness in your arms or legs.  Your arm or leg turns cold, becomes blue in color.  You develop redness, warmth, swelling and pain in your arms or legs. MAKE SURE YOU:   Understand these instructions.  Will watch your condition.  Will get help right away if you are not doing well or get worse. Document Released: 03/11/2004 Document Revised: 04/26/2011 Document Reviewed: 02/06/2008 [employer] Patient Information 2015 [employer]. This information is not intended to replace advice given to you by your health care provider. Make sure you discuss any questions you have with your health care provider.   Stroke  Prevention Some medical conditions and behaviors are associated with an increased chance of having a stroke. You may prevent a stroke by making healthy choices and managing medical conditions. HOW CAN I REDUCE MY RISK OF HAVING A STROKE?   Stay physically active. Get at least 30 minutes of activity on most or all days.  Do not smoke. It may also be helpful to avoid exposure to secondhand smoke.  Limit alcohol use. Moderate alcohol use is considered to be:  No more than 2 drinks per day for men.  No more than 1 drink per day for nonpregnant women.  Eat healthy foods. This involves:  Eating 5 or more servings of fruits and vegetables a day.  Making dietary changes that address high blood pressure (hypertension), high cholesterol, diabetes, or obesity.  Manage your cholesterol levels.  Making food choices that are high in fiber and low in saturated fat, trans fat, and cholesterol may control cholesterol levels.  Take any prescribed medicines to control cholesterol as directed by your health care provider.  Manage your diabetes.  Controlling your carbohydrate and sugar intake is recommended to manage diabetes.  Take any prescribed medicines to control diabetes as directed by your health care provider.    Control your hypertension.  Making food choices that are low in salt (sodium), saturated fat, trans fat, and cholesterol is recommended to manage hypertension.  Take any prescribed medicines to control hypertension as directed by your health care provider.  Maintain a healthy weight.  Reducing calorie intake and making food choices that are low in sodium, saturated fat, trans fat, and cholesterol are recommended to manage weight.  Stop drug abuse.  Avoid taking birth control pills.  Talk to your health care provider about the risks of taking birth control pills if you are over 35 years old, smoke, get migraines, or have ever had a blood clot.  Get evaluated for sleep  disorders (sleep apnea).  Talk to your health care provider about getting a sleep evaluation if you snore a lot or have excessive sleepiness.  Take medicines only as directed by your health care provider.  For some people, aspirin or blood thinners (anticoagulants) are helpful in reducing the risk of forming abnormal blood clots that can lead to stroke. If you have the irregular heart rhythm of atrial fibrillation, you should be on a blood thinner unless there is a good reason you cannot take them.  Understand all your medicine instructions.  Make sure that other conditions (such as anemia or atherosclerosis) are addressed. SEEK IMMEDIATE MEDICAL CARE IF:   You have sudden weakness or numbness of the face, arm, or leg, especially on one side of the body.  Your face or eyelid droops to one side.  You have sudden confusion.  You have trouble speaking (aphasia) or understanding.  You have sudden trouble seeing in one or both eyes.  You have sudden trouble walking.  You have dizziness.  You have a loss of balance or coordination.  You have a sudden, severe headache with no known cause.  You have new chest pain or an irregular heartbeat. Any of these symptoms may represent a serious problem that is an emergency. Do not wait to see if the symptoms will go away. Get medical help at once. Call your local emergency services (911 in [place] Do not drive yourself to the hospital. Document Released: 03/11/2004 Document Revised: 06/18/2013 Document Reviewed: 08/04/2012 [employer] Patient Information 2015 [employer]. This information is not intended to replace advice given to you by your health care provider. Make sure you discuss any questions you have with your health care provider.   Smoking Cessation Quitting smoking is important to your health and has many advantages. However, it is not always easy to quit since nicotine is a very addictive drug. Oftentimes, people try 3 times or more  before being able to quit. This document explains the best ways for you to prepare to quit smoking. Quitting takes hard work and a lot of effort, but you can do it. ADVANTAGES OF QUITTING SMOKING  You will live longer, feel better, and live better.  Your body will feel the impact of quitting smoking almost immediately.  Within 20 minutes, blood pressure decreases. Your pulse returns to its normal level.  After 8 hours, carbon monoxide levels in the blood return to normal. Your oxygen level increases.  After 24 hours, the chance of having a heart attack starts to decrease. Your breath, hair, and body stop smelling like smoke.  After 48 hours, damaged nerve endings begin to recover. Your sense of taste and smell improve.  After 72 hours, the body is virtually free of nicotine. Your bronchial tubes relax and breathing becomes easier.  After 2 to 12   weeks, lungs can hold more air. Exercise becomes easier and circulation improves.  The risk of having a heart attack, stroke, cancer, or lung disease is greatly reduced.  After 1 year, the risk of coronary heart disease is cut in half.  After 5 years, the risk of stroke falls to the same as a nonsmoker.  After 10 years, the risk of lung cancer is cut in half and the risk of other cancers decreases significantly.  After 15 years, the risk of coronary heart disease drops, usually to the level of a nonsmoker.  If you are pregnant, quitting smoking will improve your chances of having a healthy baby.  The people you live with, especially any children, will be healthier.  You will have extra money to spend on things other than cigarettes. QUESTIONS TO THINK ABOUT BEFORE ATTEMPTING TO QUIT You may want to talk about your answers with your health care provider.  Why do you want to quit?  If you tried to quit in the past, what helped and what did not?  What will be the most difficult situations for you after you quit? How will you plan to  handle them?  Who can help you through the tough times? Your family? Friends? A health care provider?  What pleasures do you get from smoking? What ways can you still get pleasure if you quit? Here are some questions to ask your health care provider:  How can you help me to be successful at quitting?  What medicine do you think would be best for me and how should I take it?  What should I do if I need more help?  What is smoking withdrawal like? How can I get information on withdrawal? GET READY  Set a quit date.  Change your environment by getting rid of all cigarettes, ashtrays, matches, and lighters in your home, car, or work. Do not let people smoke in your home.  Review your past attempts to quit. Think about what worked and what did not. GET SUPPORT AND ENCOURAGEMENT You have a better chance of being successful if you have help. You can get support in many ways.  Tell your family, friends, and coworkers that you are going to quit and need their support. Ask them not to smoke around you.  Get individual, group, or telephone counseling and support. Programs are available at local hospitals and health centers. Call your local health department for information about programs in your area.  Spiritual beliefs and practices may help some smokers quit.  Download a "quit meter" on your computer to keep track of quit statistics, such as how long you have gone without smoking, cigarettes not smoked, and money saved.  Get a self-help book about quitting smoking and staying off tobacco. LEARN NEW SKILLS AND BEHAVIORS  Distract yourself from urges to smoke. Talk to someone, go for a walk, or occupy your time with a task.  Change your normal routine. Take a different route to work. Drink tea instead of coffee. Eat breakfast in a different place.  Reduce your stress. Take a hot bath, exercise, or read a book.  Plan something enjoyable to do every day. Reward yourself for not  smoking.  Explore interactive web-based programs that specialize in helping you quit. GET MEDICINE AND USE IT CORRECTLY Medicines can help you stop smoking and decrease the urge to smoke. Combining medicine with the above behavioral methods and support can greatly increase your chances of successfully quitting smoking.  Nicotine replacement therapy   helps deliver nicotine to your body without the negative effects and risks of smoking. Nicotine replacement therapy includes nicotine gum, lozenges, inhalers, nasal sprays, and skin patches. Some may be available over-the-counter and others require a prescription.  Antidepressant medicine helps people abstain from smoking, but how this works is unknown. This medicine is available by prescription.  Nicotinic receptor partial agonist medicine simulates the effect of nicotine in your brain. This medicine is available by prescription. Ask your health care provider for advice about which medicines to use and how to use them based on your health history. Your health care provider will tell you what side effects to look out for if you choose to be on a medicine or therapy. Carefully read the information on the package. Do not use any other product containing nicotine while using a nicotine replacement product.  RELAPSE OR DIFFICULT SITUATIONS Most relapses occur within the first 3 months after quitting. Do not be discouraged if you start smoking again. Remember, most people try several times before finally quitting. You may have symptoms of withdrawal because your body is used to nicotine. You may crave cigarettes, be irritable, feel very hungry, cough often, get headaches, or have difficulty concentrating. The withdrawal symptoms are only temporary. They are strongest when you first quit, but they will go away within 10-14 days. To reduce the chances of relapse, try to:  Avoid drinking alcohol. Drinking lowers your chances of successfully quitting.  Reduce the  amount of caffeine you consume. Once you quit smoking, the amount of caffeine in your body increases and can give you symptoms, such as a rapid heartbeat, sweating, and anxiety.  Avoid smokers because they can make you want to smoke.  Do not let weight gain distract you. Many smokers will gain weight when they quit, usually less than 10 pounds. Eat a healthy diet and stay active. You can always lose the weight gained after you quit.  Find ways to improve your mood other than smoking. FOR MORE INFORMATION  www.smokefree.gov  Document Released: 01/26/2001 Document Revised: 06/18/2013 Document Reviewed: 05/13/2011 [employer] Patient Information 2015 [employer]. This information is not intended to replace advice given to you by your health care provider. Make sure you discuss any questions you have with your health care provider.  

## 2013-11-08 NOTE — Progress Notes (Signed)
[facility] [place] HISTORY AND PHYSICAL   MRN : [id number]  History of Present Illness:   Denise [name] is a 68 y.o. female patient of Dr. Darrick Penna who is status post right femoral to above-knee popliteal bypass with propaten on January 23, 2013. She returns today for further followup.  She states her claudication symptoms have completely resolved, denies any non healing wounds.  She had an MI about 1997, had a TIA about this time also as manifested by numbness/weakness in left arm and leg, saw a neurologist, left arm has some mild residual weakness. Pt denies any further stroke or TIA activity. The last carotid artery Duplex on file was performed 02/19/2010, followed by her cardiologists, Dr's. Alanda Amass and Allyson Sabal; results indicated a small amount of fibrous plaque in the right ICA, 0-49% stenosis in left ICA.  She denies any tingling, numbness, or pain in either hand or arm.  The patient denies New Medical or Surgical History.   She walks a great deal, does not use ETOH.  She will start some Chantix soon.  Pt Diabetic: No  Pt smoker: smoker (1/2 ppd, decreased from 3/4 ppd, smoking since age 15 yrs)  Pt meds include:  Statin :No, has severe myalgias with taking statins  ASA: Yes  Other anticoagulants/antiplatelets: no    Current Outpatient Prescriptions  Medication Sig Dispense Refill  . ADVAIR DISKUS 250-50 MCG/DOSE AEPB Inhale 1 puff into the lungs as needed. Prn      . albuterol (PROVENTIL) 2 MG tablet prn      . amLODipine (NORVASC) 5 MG tablet TAKE 1 TABLET BY MOUTH DAILY  90 tablet  0  . aspirin 81 MG tablet Take 81 mg by mouth daily.      . cetirizine (ZYRTEC) 10 MG tablet Take 10 mg by mouth daily.      . CHANTIX 0.5 MG tablet Take 0.5 mg by mouth daily.       . citalopram (CELEXA) 40 MG tablet Take 40 mg by mouth daily.       Marland Kitchen LORazepam (ATIVAN) 0.5 MG tablet Take 0.5 mg by mouth every 8 (eight) hours as needed for anxiety.       . Omega-3 Fatty  Acids (FISH OIL) 1000 MG CAPS Take 1 capsule by mouth 2 (two) times daily.       Marland Kitchen omeprazole (PRILOSEC) 20 MG capsule Take 1 capsule (20 mg total) by mouth daily.  90 capsule  3  . telmisartan-hydrochlorothiazide (MICARDIS HCT) 40-12.5 MG per tablet TAKE 1 TABLET BY MOUTH DAILY  90 tablet  0  . ZETIA 10 MG tablet Take 1 tablet by mouth 3 (three) times a week. Tuesday, Thursday, Sunday.      . diphenoxylate-atropine (LOMOTIL) 2.5-0.025 MG per tablet Treats diarrhea       No current facility-administered medications for this visit.    Past Medical History  Diagnosis Date  . GERD (gastroesophageal reflux disease)   . Hyperlipidemia   . Hypertension   . Coronary artery disease 09/09/2011    stress - non-gated; nod/severe perfusion defect in basal/mid inferior, basa/mid inferolateral and apical lateral regions consistient w. scar/infarct; 2009 stress test EF 53% mild/mod perfusion defect  . Shortness of breath   . Stroke     many years ago, "light stroke"  . Weakness of left leg 01/20/2004    left arm/leg both weak, normal echo, no signs emboli  . Cerebral atherosclerosis 02/19/2010    doppler -L ICA mid 0-49% reduction  .  Bradycardia, drug induced     resolved w/ d/c bystolic  . Gall bladder disease   . Heart attack ~ 17 years ago  . COPD (chronic obstructive pulmonary disease)   . PAF (paroxysmal atrial fibrillation)     post operative in 2009, no recurrences    Social History History  Substance Use Topics  . Smoking status: Light Tobacco Smoker -- 0.50 packs/day for 50 years    Types: Cigarettes  . Smokeless tobacco: Never Used  . Alcohol Use: Yes     Comment: occasional reports maybe 5 drinks a year    Family History Family History  Problem Relation Age of Onset  . Heart failure Mother   . Heart disease Mother   . Hyperlipidemia Mother   . Cancer Father     bone    Surgical History Past Surgical History  Procedure Laterality Date  . Abdominal hysterectomy  1978,  1982    partial, then complete  . Coronary artery bypass graft  01/2008    x4; LIMA to LAD, SVG to diagonal, SVG to circumflex marginal, SVG to RCA with EVH of R lower extremity   . Cholecystectomy  10/14/2011    Procedure: LAPAROSCOPIC CHOLECYSTECTOMY WITH INTRAOPERATIVE CHOLANGIOGRAM;  Surgeon: Robyne Askew, MD;  Location: [facility] OR;  Service: General;  Laterality: N/A;  . Tee without cardioversion  02/07/2008    used intraoperatively w/ CABG to assess LV function,   . Cardiac catheterization  01/30/2008    severe 3 vessel disease, needing CABG, EF 40-45%  . Femoral-popliteal bypass graft Right 01/23/2013    Procedure: BYPASS GRAFT FEMORALTO ABOVE KNEE POPLITEAL ARTERY;  Surgeon: Sherren Kerns, MD;  Location: [facility] OR;  Service: Vascular;  Laterality: Right;    Allergies  Allergen Reactions  . Beta Adrenergic Blockers Other (See Comments)    Significant bradycardia  . Codeine Nausea Only    dizziness  . Statins Other (See Comments)    Muscle aches w/ statins    Current Outpatient Prescriptions  Medication Sig Dispense Refill  . ADVAIR DISKUS 250-50 MCG/DOSE AEPB Inhale 1 puff into the lungs as needed. Prn      . albuterol (PROVENTIL) 2 MG tablet prn      . amLODipine (NORVASC) 5 MG tablet TAKE 1 TABLET BY MOUTH DAILY  90 tablet  0  . aspirin 81 MG tablet Take 81 mg by mouth daily.      . cetirizine (ZYRTEC) 10 MG tablet Take 10 mg by mouth daily.      . CHANTIX 0.5 MG tablet Take 0.5 mg by mouth daily.       . citalopram (CELEXA) 40 MG tablet Take 40 mg by mouth daily.       Marland Kitchen LORazepam (ATIVAN) 0.5 MG tablet Take 0.5 mg by mouth every 8 (eight) hours as needed for anxiety.       . Omega-3 Fatty Acids (FISH OIL) 1000 MG CAPS Take 1 capsule by mouth 2 (two) times daily.       Marland Kitchen omeprazole (PRILOSEC) 20 MG capsule Take 1 capsule (20 mg total) by mouth daily.  90 capsule  3  . telmisartan-hydrochlorothiazide (MICARDIS HCT) 40-12.5 MG per tablet TAKE 1 TABLET BY MOUTH DAILY  90 tablet   0  . ZETIA 10 MG tablet Take 1 tablet by mouth 3 (three) times a week. Tuesday, Thursday, Sunday.      . diphenoxylate-atropine (LOMOTIL) 2.5-0.025 MG per tablet Treats diarrhea       No  current facility-administered medications for this visit.     REVIEW OF SYSTEMS: See HPI for pertinent positives and negatives.  Physical Examination Filed Vitals:   11/08/13 1202 11/08/13 1206  BP: 121/73 104/63  Pulse: 52 48  Resp:  14  Height:  5' 3.5" (1.613 m)  Weight:  153 lb (69.4 kg)  SpO2:  99%   Body mass index is 26.67 kg/(m^2).  General: A&O x 3, WDWN.  Gait: normal  Eyes: PERRLA.  Pulmonary: CTAB, without wheezes , rales or rhonchi.  Cardiac: regular Rythm , without detected murmur.   Carotid Bruits  Left  Right    Negative  Negative    Aorta is not palpable.  Radial pulses: radial and ulnar absent left, faint left brachial pulse. Right radial pulse 2+.   VASCULAR EXAM:  Extremities without ischemic changes  without Gangrene; without open wounds.   LE Pulses  LEFT  RIGHT   FEMORAL  2+palpable  1+ pable   POPLITEAL  not palpable  not palpable   POSTERIOR TIBIAL  1+ palpable  2+ palpable   DORSALIS PEDIS  ANTERIOR TIBIAL  2+palpable  2+palpable    Abdomen: soft, NT, no masses palpated.  Skin: no rashes, no ulcers noted.  Musculoskeletal: no muscle wasting or atrophy.  Neurologic: A&O X 3; Appropriate Affect ; SENSATION: normal; MOTOR FUNCTION: moving all extremities equally, motor strength 5/5 throughout. Speech is fluent/normal. CN 2-12 intact.   Significant Diagnostic Studies: 08/23/2012 Carotid Duplex result found:  1. Bilateral carotid bifurcation and proximal ICA plaque,  resulting in less than 50% diameter stenosis. The exam does not  exclude plaque ulceration or embolization. Continued surveillance  recommended.  2. Origin stenosis of the left external carotid artery, of  questionable clinical significance.  3. Reversal of systolic flow direction in the left  vertebral  artery suggesting proximal occlusive disease. Correlate with any  clinical evidence of subclavian steal.     Non-Invasive Vascular Imaging (11/08/2013):   LOWER EXTREMITY ARTERIAL DUPLEX EVALUATION    INDICATION: Follow up bypass graft     PREVIOUS INTERVENTION(S): Right femoral to above knee popliteal artery bypass graft on 01/23/13    DUPLEX EXAM:     RIGHT  LEFT   Peak Systolic Velocity (cm/s) Ratio (if abnormal) Waveform  Peak Systolic Velocity (cm/s) Ratio (if abnormal) Waveform  161  B Inflow Artery     141  T Proximal Anastomosis     104  T Proximal Graft     96  T Mid Graft     91  T  Distal Graft     104  T Distal Anastomosis     97  T Outflow Artery     1.25 Today's ABI / TBI 1.14  1.21 Previous ABI / TBI (08/02/13 ) 1.22    Waveform:    M - Monophasic       B - Biphasic       T - Triphasic  If Ankle Brachial Index (ABI) or Toe Brachial Index (TBI) performed, please see complete report     ADDITIONAL FINDINGS:   No internal vessel narrowing noted within the bypass graft or anastomosis.   Partially-occlusive plaque noted in the right common femoral artery.   Cystic area, with no extravascular flow, noted surrounding the bypass graft focally at the mid thigh level and measuring roughly 1.7 x 1.3cm.    IMPRESSION: Patent right leg bypass graft with no evidence of stenosis noted.    Compared to the previous  exam:  No significant change in the bypass graft when compared to the exam on 08/02/13.     ASSESSMENT:  Denise [name] is a 68 y.o. female who is status post right femoral to above-knee popliteal bypass with propaten on January 23, 2013. She also has known carotid artery stenosis. She has no claudication symptoms. Today's ABI's are normal in both legs. The right leg bypass graft is patent  with no evidence of stenosis, no significant change in the bypass graft when compared to the exam on 08/02/13.  She had a TIA in 1997 at the time of her MI, but  apparently no TIA or stroke activity since then. She declined to have a carotid Duplex today until I explained to her that she has documented carotid artery disease and it should be monitored to be able to intervene if needed to help prevent a stroke. Will check carotid Duplex when she returns in 3 months for routine LE bypass surveillance. She continues to smoke, but has decreased use, has a Chantix prescription, and is considering what her quit date will be.   PLAN:   Based on today's exam and non-invasive vascular lab results, the patient will follow up in 3 months with the following tests left LE arterial Duplex, ABI's, and carotid Duplex . She was again counseled re smoking cessation. I discussed in depth with the patient the nature of atherosclerosis, and emphasized the importance of maximal medical management including strict control of blood pressure, blood glucose, and lipid levels, obtaining regular exercise, and cessation of smoking.  The patient is aware that without maximal medical management the underlying atherosclerotic disease process will progress, limiting the benefit of any interventions.  The patient was given information about stroke prevention and what symptoms should prompt the patient to seek immediate medical care.  The patient was given information about PAD including signs, symptoms, treatment, what symptoms should prompt the patient to seek immediate medical care, and risk reduction measures to take. Thank you for allowing [place] to participate in this patient's care.  Charisse March, RN, MSN, FNP-C [facility] Office: [phone]  Clinic MD: Sena Hitch 11/08/2013 12:22 PM

## 2013-12-26 ENCOUNTER — Other Ambulatory Visit: Payer: Self-pay | Admitting: Cardiology

## 2014-01-14 ENCOUNTER — Encounter: Payer: Self-pay | Admitting: Cardiology

## 2014-01-14 ENCOUNTER — Ambulatory Visit (INDEPENDENT_AMBULATORY_CARE_PROVIDER_SITE_OTHER): Payer: Medicare Other | Admitting: Cardiology

## 2014-01-14 VITALS — BP 130/72 | HR 61 | Ht 63.5 in | Wt 154.0 lb

## 2014-01-14 DIAGNOSIS — I2510 Atherosclerotic heart disease of native coronary artery without angina pectoris: Secondary | ICD-10-CM

## 2014-01-14 DIAGNOSIS — I10 Essential (primary) hypertension: Secondary | ICD-10-CM

## 2014-01-14 DIAGNOSIS — E785 Hyperlipidemia, unspecified: Secondary | ICD-10-CM

## 2014-01-14 DIAGNOSIS — I739 Peripheral vascular disease, unspecified: Secondary | ICD-10-CM

## 2014-01-14 DIAGNOSIS — I70219 Atherosclerosis of native arteries of extremities with intermittent claudication, unspecified extremity: Secondary | ICD-10-CM

## 2014-01-14 NOTE — Progress Notes (Signed)
[address] [place], [place]  [address] Phone: [phone] Fax:  [phone]) [phone]  Date:  01/14/2014   ID:  Denise [name], DOB Mar 27, 1945, MRN [id number]  PCP:  Tomi BambergerFULLER,SUSAN, NP   History of Present Illness: Denise [name] is a 68 y.o. female ([name], brother of my patient Denise ShellJimmy [name]) with hypertension, hyperlipidemia, smoker, prior stroke, COPD with coronary artery disease status post myocardial infarction and RCA stent in 1998, LAD stent x2 in 2004 then subsequent CABG (LIMA to LAD, SVG to diagonal, SVG to circumflex, SVG to RCA) in 2009 with postoperative paroxysmal atrial fibrillation, peripheral arterial disease seen by Dr. Fabienne Brunsharles Fields status post right femoral to above-knee popliteal bypass on 01/23/13 here for followup. She is a former patient of Dr. Caprice KluverAl Little.  She's no longer having right leg claudication. No chest pain, shortness of breath. She has battled with smoking for over 40 years. She has not taken Chantix. She is not ready to quit.  Left-sided bruit noted, carotid. Her primary physician is checking her cholesterol levels.   Wt Readings from Last 3 Encounters:  01/14/14 154 lb (69.854 kg)  11/08/13 153 lb (69.4 kg)  08/02/13 156 lb (70.761 kg)     Past Medical History  Diagnosis Date  . GERD (gastroesophageal reflux disease)   . Hyperlipidemia   . Hypertension   . Coronary artery disease 09/09/2011    stress - non-gated; nod/severe perfusion defect in basal/mid inferior, basa/mid inferolateral and apical lateral regions consistient w. scar/infarct; 2009 stress test EF 53% mild/mod perfusion defect  . Shortness of breath   . Stroke     many years ago, "light stroke"  . Weakness of left leg 01/20/2004    left arm/leg both weak, normal echo, no signs emboli  . Cerebral atherosclerosis 02/19/2010    doppler -L ICA mid 0-49% reduction  . Bradycardia, drug induced     resolved w/ d/c bystolic  . Gall bladder disease   . Heart attack ~ 17 years ago    . COPD (chronic obstructive pulmonary disease)   . PAF (paroxysmal atrial fibrillation)     post operative in 2009, no recurrences    Past Surgical History  Procedure Laterality Date  . Abdominal hysterectomy  1978, 1982    partial, then complete  . Coronary artery bypass graft  01/2008    x4; LIMA to LAD, SVG to diagonal, SVG to circumflex marginal, SVG to RCA with EVH of R lower extremity   . Cholecystectomy  10/14/2011    Procedure: LAPAROSCOPIC CHOLECYSTECTOMY WITH INTRAOPERATIVE CHOLANGIOGRAM;  Surgeon: Robyne AskewPaul S Toth III, MD;  [facility] OR;  Service: General;  Laterality: N/A;  . Tee without cardioversion  02/07/2008    used intraoperatively w/ CABG to assess LV function,   . Cardiac catheterization  01/30/2008    severe 3 vessel disease, needing CABG, EF 40-45%  . Femoral-popliteal bypass graft Right 01/23/2013    Procedure: BYPASS GRAFT FEMORALTO ABOVE KNEE POPLITEAL ARTERY;  Surgeon: Sherren Kernsharles E Fields, MD;  [facility] OR;  Service: Vascular;  Laterality: Right;    Current Outpatient Prescriptions  Medication Sig Dispense Refill  . ADVAIR DISKUS 250-50 MCG/DOSE AEPB Inhale 1 puff into the lungs as needed. Prn    . amLODipine (NORVASC) 5 MG tablet TAKE 1 TABLET BY MOUTH DAILY 90 tablet 1  . aspirin 81 MG tablet Take 81 mg by mouth daily.    . cetirizine (ZYRTEC) 10 MG tablet Take 10  mg by mouth daily.    . citalopram (CELEXA) 40 MG tablet Take 40 mg by mouth daily.     Marland Kitchen. LORazepam (ATIVAN) 0.5 MG tablet Take 0.5 mg by mouth every 8 (eight) hours as needed for anxiety.     . Omega-3 Fatty Acids (FISH OIL) 1000 MG CAPS Take 1 capsule by mouth 2 (two) times daily.     Marland Kitchen. omeprazole (PRILOSEC) 20 MG capsule Take 1 capsule (20 mg total) by mouth daily. 90 capsule 3  . telmisartan-hydrochlorothiazide (MICARDIS HCT) 40-12.5 MG per tablet TAKE 1 TABLET BY MOUTH DAILY 90 tablet 1  . ZETIA 10 MG tablet TAKE 1 TABLET BY MOUTH EVERY DAY 90 tablet 1   No current facility-administered  medications for this visit.    Allergies:    Allergies  Allergen Reactions  . Beta Adrenergic Blockers Other (See Comments)    Significant bradycardia  . Codeine Nausea Only    dizziness  . Statins Other (See Comments)    Muscle aches w/ statins    Social History:  The patient  reports that she has been smoking Cigarettes.  She has a 25 pack-year smoking history. She has never used smokeless tobacco. She reports that she drinks alcohol. She reports that she does not use illicit drugs.   Family History  Problem Relation Age of Onset  . Heart failure Mother   . Heart disease Mother   . Hyperlipidemia Mother   . Cancer Father     bone    ROS:  Please see the history of present illness.   Denies any fevers, chills, orthopnea, PND   All other systems reviewed and negative.   PHYSICAL EXAM: VS:  BP 130/72 mmHg  Pulse 61  Ht 5' 3.5" (1.613 m)  Wt 154 lb (69.854 kg)  BMI 26.85 kg/m2 Well nourished, well developed, in no acute distress HEENT: normal, [place]/AT, EOMI Neck: no JVD, normal carotid upstroke, left sided bruit Cardiac:  normal S1, S2; RRR; no murmurDistant heart sounds Lungs:  No active wheezing. Normal respiratory effort Abd: soft, nontender, no hepatomegaly, no bruits Ext: no edema, palpable distal pulses Skin: warm and dry GU: deferred Neuro: no focal abnormalities noted, AAO x 3  EKG:   01/14/14-sinus rhythm 61, nonspecific ST-T wave changes, subtle T-wave inversion inferior leads and lateral leads, PVC. No significant change from prior-11/15 - NSR, NSSTW changes.   ASSESSMENT AND PLAN:  1. Old MI-status post RCA stent. Bypass. Doing well. 2. Coronary artery disease-no active anginal symptoms. Aggressive secondary prevention. 3. Peripheral vascular disease-reviewed notes from Dr. Darrick PennaFields. Bypass surgery right lower extremity. Aspirin. Tobacco cessation. Exercise. Left-sided carotid bruit noted. He will be checking carotid ultrasounds. Secondary  prevention. 4. Tobacco use-counseled on tobacco cessation. Encouraged use of the American Heart Association website. 5. Hypertension-very well controlled. 6. Hyperlipidemia-Zetia 3 days a week. She has not been able to tolerate statins previously. Her primary is checking lipids soon. 7. 6 month f/u.  Signed, Donato SchultzMark Skains, MD [facility]  01/14/2014 8:41 AM

## 2014-01-14 NOTE — Patient Instructions (Addendum)
The current medical regimen is effective;  continue present plan and medications.  Follow up in 6 months with Dr. Skains.  You will receive a letter in the mail 2 months before you are due.  Please call us when you receive this letter to schedule your follow up appointment.  Thank you for choosing  [facility]!!     

## 2014-01-21 ENCOUNTER — Encounter: Payer: Self-pay | Admitting: Pharmacist

## 2014-01-21 NOTE — Telephone Encounter (Signed)
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 Did ask patient if she had ever gotten any sort of bill with her name, stated no.  Advised if she received any bills with her name to let us know, verbalized understanding.

## 2014-01-24 ENCOUNTER — Encounter (HOSPITAL_COMMUNITY): Payer: Self-pay | Admitting: Vascular Surgery

## 2014-01-25 ENCOUNTER — Other Ambulatory Visit (HOSPITAL_COMMUNITY): Payer: Medicare Other

## 2014-01-25 ENCOUNTER — Ambulatory Visit: Payer: Medicare Other | Admitting: Family

## 2014-01-25 ENCOUNTER — Encounter (HOSPITAL_COMMUNITY): Payer: Medicare Other

## 2014-02-14 ENCOUNTER — Other Ambulatory Visit (HOSPITAL_COMMUNITY): Payer: Medicare Other

## 2014-02-14 ENCOUNTER — Encounter (HOSPITAL_COMMUNITY): Payer: Medicare Other

## 2014-02-14 ENCOUNTER — Ambulatory Visit: Payer: Medicare Other | Admitting: Family

## 2014-02-28 ENCOUNTER — Encounter: Payer: Self-pay | Admitting: Family

## 2014-03-01 ENCOUNTER — Ambulatory Visit (HOSPITAL_COMMUNITY)
Admission: RE | Admit: 2014-03-01 | Discharge: 2014-03-01 | Disposition: A | Discharge status: Home / Self Care | Payer: Medicare Other | Source: Ambulatory Visit | Attending: Family | Admitting: Family

## 2014-03-01 ENCOUNTER — Ambulatory Visit (INDEPENDENT_AMBULATORY_CARE_PROVIDER_SITE_OTHER)
Admission: RE | Admit: 2014-03-01 | Discharge: 2014-03-01 | Disposition: A | Discharge status: Home / Self Care | Payer: Medicare Other | Source: Ambulatory Visit | Attending: Family | Admitting: Family

## 2014-03-01 ENCOUNTER — Encounter (INDEPENDENT_AMBULATORY_CARE_PROVIDER_SITE_OTHER): Payer: Medicare Other | Admitting: Family

## 2014-03-01 DIAGNOSIS — Z48812 Encounter for surgical aftercare following surgery on the circulatory system: Secondary | ICD-10-CM

## 2014-03-01 DIAGNOSIS — I6523 Occlusion and stenosis of bilateral carotid arteries: Secondary | ICD-10-CM | POA: Diagnosis not present

## 2014-03-01 DIAGNOSIS — I739 Peripheral vascular disease, unspecified: Secondary | ICD-10-CM

## 2014-03-04 NOTE — Patient Instructions (Addendum)
Dear Denise [name], Your recent Vascular lab visit on Jan. 15, 2016 indicates: No change, 6 mos. Post op 2nd year leg bypass surveillance recommended.          Stroke Prevention Some medical conditions and behaviors are associated with an increased chance of having a stroke. You may prevent a stroke by making healthy choices and managing medical conditions. HOW CAN I REDUCE MY RISK OF HAVING A STROKE?   Stay physically active. Get at least 30 minutes of activity on most or all days.  Do not smoke. It may also be helpful to avoid exposure to secondhand smoke.  Limit alcohol use. Moderate alcohol use is considered to be:  No more than 2 drinks per day for men.  No more than 1 drink per day for nonpregnant women.  Eat healthy foods. This involves:  Eating 5 or more servings of fruits and vegetables a day.  Making dietary changes that address high blood pressure (hypertension), high cholesterol, diabetes, or obesity.  Manage your cholesterol levels.  Making food choices that are high in fiber and low in saturated fat, trans fat, and cholesterol may control cholesterol levels.  Take any prescribed medicines to control cholesterol as directed by your health care provider.  Manage your diabetes.  Controlling your carbohydrate and sugar intake is recommended to manage diabetes.  Take any prescribed medicines to control diabetes as directed by your health care provider.  Control your hypertension.  Making food choices that are low in salt (sodium), saturated fat, trans fat, and cholesterol is recommended to manage hypertension.  Take any prescribed medicines to control hypertension as directed by your health care provider.  Maintain a healthy weight.  Reducing calorie intake and making food choices that are low in sodium, saturated fat, trans fat, and cholesterol are recommended to manage weight.  Stop drug abuse.  Avoid taking birth control pills.  Talk to your health  care provider about the risks of taking birth control pills if you are over 37 years old, smoke, get migraines, or have ever had a blood clot.  Get evaluated for sleep disorders (sleep apnea).  Talk to your health care provider about getting a sleep evaluation if you snore a lot or have excessive sleepiness.  Take medicines only as directed by your health care provider.  For some people, aspirin or blood thinners (anticoagulants) are helpful in reducing the risk of forming abnormal blood clots that can lead to stroke. If you have the irregular heart rhythm of atrial fibrillation, you should be on a blood thinner unless there is a good reason you cannot take them.  Understand all your medicine instructions.  Make sure that other conditions (such as anemia or atherosclerosis) are addressed. SEEK IMMEDIATE MEDICAL CARE IF:   You have sudden weakness or numbness of the face, arm, or leg, especially on one side of the body.  Your face or eyelid droops to one side.  You have sudden confusion.  You have trouble speaking (aphasia) or understanding.  You have sudden trouble seeing in one or both eyes.  You have sudden trouble walking.  You have dizziness.  You have a loss of balance or coordination.  You have a sudden, severe headache with no known cause.  You have new chest pain or an irregular heartbeat. Any of these symptoms may represent a serious problem that is an emergency. Do not wait to see if the symptoms will go away. Get medical help at once. Call your local emergency services (  911 in [place] Do not drive yourself to the hospital. Document Released: 03/11/2004 Document Revised: 06/18/2013 Document Reviewed: 08/04/2012 [facility] Patient Information 2015 [employer], [place]. This information is not intended to replace advice given to you by your health care provider. Make sure you discuss any questions you have with your health care provider. Peripheral Vascular Disease Peripheral  Vascular Disease (PVD), also called Peripheral Arterial Disease (PAD), is a circulation problem caused by cholesterol (atherosclerotic plaque) deposits in the arteries. PVD commonly occurs in the lower extremities (legs) but it can occur in other areas of the body, such as your arms. The cholesterol buildup in the arteries reduces blood flow which can cause pain and other serious problems. The presence of PVD can place a person at risk for Coronary Artery Disease (CAD).  CAUSES  Causes of PVD can be many. It is usually associated with more than one risk factor such as:   High Cholesterol.  Smoking.  Diabetes.  Lack of exercise or inactivity.  High blood pressure (hypertension).  Obesity.  Family history. SYMPTOMS   When the lower extremities are affected, patients with PVD may experience:  Leg pain with exertion or physical activity. This is called INTERMITTENT CLAUDICATION. This may present as cramping or numbness with physical activity. The location of the pain is associated with the level of blockage. For example, blockage at the abdominal level (distal abdominal aorta) may result in buttock or hip pain. Lower leg arterial blockage may result in calf pain.  As PVD becomes more severe, pain can develop with less physical activity.  In people with severe PVD, leg pain may occur at rest.  Other PVD signs and symptoms:  Leg numbness or weakness.  Coldness in the affected leg or foot, especially when compared to the other leg.  A change in leg color.  Patients with significant PVD are more prone to ulcers or sores on toes, feet or legs. These may take longer to heal or may reoccur. The ulcers or sores can become infected.  If signs and symptoms of PVD are ignored, gangrene may occur. This can result in the loss of toes or loss of an entire limb.  Not all leg pain is related to PVD. Other medical conditions can cause leg pain such as:  Blood clots (embolism) or Deep Vein  Thrombosis.  Inflammation of the blood vessels (vasculitis).  Spinal stenosis. DIAGNOSIS  Diagnosis of PVD can involve several different types of tests. These can include:  Pulse Volume Recording Method (PVR). This test is simple, painless and does not involve the use of X-rays. PVR involves measuring and comparing the blood pressure in the arms and legs. An ABI (Ankle-Brachial Index) is calculated. The normal ratio of blood pressures is 1. As this number becomes smaller, it indicates more severe disease.  < 0.95 - indicates significant narrowing in one or more leg vessels.  <0.8 - there will usually be pain in the foot, leg or buttock with exercise.  <0.4 - will usually have pain in the legs at rest.  <0.25 - usually indicates limb threatening PVD.  Doppler detection of pulses in the legs. This test is painless and checks to see if you have a pulses in your legs/feet.  A dye or contrast material (a substance that highlights the blood vessels so they show up on x-ray) may be given to help your caregiver better see the arteries for the following tests. The dye is eliminated from your body by the kidney's. Your caregiver may order  blood work to check your kidney function and other laboratory values before the following tests are performed:  Magnetic Resonance Angiography (MRA). An MRA is a picture study of the blood vessels and arteries. The MRA machine uses a large magnet to produce images of the blood vessels.  Computed Tomography Angiography (CTA). A CTA is a specialized x-ray that looks at how the blood flows in your blood vessels. An IV may be inserted into your arm so contrast dye can be injected.  Angiogram. Is a procedure that uses x-rays to look at your blood vessels. This procedure is minimally invasive, meaning a small incision (cut) is made in your groin. A small tube (catheter) is then inserted into the artery of your groin. The catheter is guided to the blood vessel or artery  your caregiver wants to examine. Contrast dye is injected into the catheter. X-rays are then taken of the blood vessel or artery. After the images are obtained, the catheter is taken out. TREATMENT  Treatment of PVD involves many interventions which may include:  Lifestyle changes:  Quitting smoking.  Exercise.  Following a low fat, low cholesterol diet.  Control of diabetes.  Foot care is very important to the PVD patient. Good foot care can help prevent infection.  Medication:  Cholesterol-lowering medicine.  Blood pressure medicine.  Anti-platelet drugs.  Certain medicines may reduce symptoms of Intermittent Claudication.  Interventional/Surgical options:  Angioplasty. An Angioplasty is a procedure that inflates a balloon in the blocked artery. This opens the blocked artery to improve blood flow.  Stent Implant. A wire mesh tube (stent) is placed in the artery. The stent expands and stays in place, allowing the artery to remain open.  Peripheral Bypass Surgery. This is a surgical procedure that reroutes the blood around a blocked artery to help improve blood flow. This type of procedure may be performed if Angioplasty or stent implants are not an option. SEEK IMMEDIATE MEDICAL CARE IF:   You develop pain or numbness in your arms or legs.  Your arm or leg turns cold, becomes blue in color.  You develop redness, warmth, swelling and pain in your arms or legs. MAKE SURE YOU:   Understand these instructions.  Will watch your condition.  Will get help right away if you are not doing well or get worse. Document Released: 03/11/2004 Document Revised: 04/26/2011 Document Reviewed: 02/06/2008 [facility] Patient Information 2015 [place], [place]. This information is not intended to replace advice given to you by your health care provider. Make sure you discuss any questions you have with your health care provider.

## 2014-03-05 NOTE — Progress Notes (Signed)
Lab only 

## 2014-03-08 ENCOUNTER — Encounter: Payer: Self-pay | Admitting: Vascular Surgery

## 2014-03-11 ENCOUNTER — Encounter: Payer: Self-pay | Admitting: Vascular Surgery

## 2014-03-11 ENCOUNTER — Telehealth: Payer: Self-pay | Admitting: Vascular Surgery

## 2014-03-11 NOTE — Telephone Encounter (Signed)
Patient called once again inquiring on results of the vascular studies performed 03/01/2014.  Two message were sent to the nurse of the NP with no return call or follow-up.   There had been no changes in the exam from the previous exam. Mrs. Denise [name] has requested to be followed on a yearly basis instead of six months.  I explained the protocol would be every six months but I could schedule as she has requested.   She assured me she would call should she have any symptoms.   The carotid ultrasound was not addressed.  The results show less than 40% bilaterally, so I advised this and scheduled a carotid duplex along with her abi's, and lower arterial duplex in 1 year.  In the future she doesn't wish to see the NP.

## 2014-04-05 ENCOUNTER — Ambulatory Visit: Payer: Self-pay | Admitting: Gastroenterology

## 2014-04-23 ENCOUNTER — Encounter: Payer: Self-pay | Admitting: Cardiology

## 2014-05-22 ENCOUNTER — Other Ambulatory Visit: Payer: Self-pay | Admitting: Nurse Practitioner

## 2014-05-22 DIAGNOSIS — J329 Chronic sinusitis, unspecified: Secondary | ICD-10-CM

## 2014-05-23 ENCOUNTER — Ambulatory Visit
Admission: RE | Admit: 2014-05-23 | Discharge: 2014-05-23 | Disposition: A | Discharge status: Home / Self Care | Payer: Medicare Other | Source: Ambulatory Visit | Attending: Nurse Practitioner | Admitting: Nurse Practitioner

## 2014-05-23 DIAGNOSIS — J329 Chronic sinusitis, unspecified: Secondary | ICD-10-CM

## 2014-05-28 ENCOUNTER — Ambulatory Visit (INDEPENDENT_AMBULATORY_CARE_PROVIDER_SITE_OTHER): Payer: Medicare Other | Admitting: Cardiology

## 2014-05-28 ENCOUNTER — Encounter: Payer: Self-pay | Admitting: Cardiology

## 2014-05-28 VITALS — BP 102/52 | HR 68 | Ht 63.5 in | Wt 155.0 lb

## 2014-05-28 DIAGNOSIS — R9431 Abnormal electrocardiogram [ECG] [EKG]: Secondary | ICD-10-CM | POA: Diagnosis not present

## 2014-05-28 DIAGNOSIS — I252 Old myocardial infarction: Secondary | ICD-10-CM | POA: Diagnosis not present

## 2014-05-28 DIAGNOSIS — I2510 Atherosclerotic heart disease of native coronary artery without angina pectoris: Secondary | ICD-10-CM | POA: Diagnosis not present

## 2014-05-28 DIAGNOSIS — I10 Essential (primary) hypertension: Secondary | ICD-10-CM | POA: Diagnosis not present

## 2014-05-28 DIAGNOSIS — J189 Pneumonia, unspecified organism: Secondary | ICD-10-CM

## 2014-05-28 NOTE — Patient Instructions (Addendum)
Please stop your Amlodipine.  Continue all other medications as listed.   Follow up in 6 months with Dr. Anne FuSkains.  You will receive a letter in the mail 2 months before you are due.  Please call us when you receive this letter to schedule your follow up appointment.  Thank you for choosing [facility]!!

## 2014-05-28 NOTE — Progress Notes (Signed)
[address] [place], [place]  [address] Phone: [phone] Fax:  [phone]  Date:  05/28/2014   ID:  Denise [name], DOB 1945-10-13, MRN [id number]  PCP:  Tomi Bamberger, NP   History of Present Illness: Denise [name] is a 69 y.o. female ([name], brother of my patient Denise [name]) with hypertension, hyperlipidemia, smoker, prior stroke, COPD with coronary artery disease status post myocardial infarction and RCA stent in 1998, LAD stent x2 in 2004 then subsequent CABG (LIMA to LAD, SVG to diagonal, SVG to circumflex, SVG to RCA) in 2009 with postoperative paroxysmal atrial fibrillation, peripheral arterial disease seen by Dr. Fabienne Bruns status post right femoral to above-knee popliteal bypass on 01/23/13 here for followup. She is a former patient of Dr. Caprice Kluver.  She's no longer having right leg claudication. No chest pain, shortness of breath. She has battled with smoking for over 40 years. She has not taken Chantix. She is not ready to quit.  Left-sided bruit noted, carotid. Her primary is checking her cholesterol levels.   05/28/14-has been battling with a cough/cold for several weeks. Recently had CT scan done, chest x-ray , EKG. Kaori full was concerned about EKG abnormalities. I compared with the old one and there is no change. No anginal symptoms.  Wt Readings from Last 3 Encounters:  05/28/14 155 lb (70.308 kg)  01/14/14 154 lb (69.854 kg)  11/08/13 153 lb (69.4 kg)     Past Medical History  Diagnosis Date  . GERD (gastroesophageal reflux disease)   . Hyperlipidemia   . Hypertension   . Coronary artery disease 09/09/2011    stress - non-gated; nod/severe perfusion defect in basal/mid inferior, basa/mid inferolateral and apical lateral regions consistient w. scar/infarct; 2009 stress test EF 53% mild/mod perfusion defect  . Shortness of breath   . Stroke     many years ago, "light stroke"  . Weakness of left leg 01/20/2004    left arm/leg both weak,  normal echo, no signs emboli  . Cerebral atherosclerosis 02/19/2010    doppler -L ICA mid 0-49% reduction  . Bradycardia, drug induced     resolved w/ d/c bystolic  . Gall bladder disease   . Heart attack ~ 17 years ago  . COPD (chronic obstructive pulmonary disease)   . PAF (paroxysmal atrial fibrillation)     post operative in 2009, no recurrences    Past Surgical History  Procedure Laterality Date  . Abdominal hysterectomy  1978, 1982    partial, then complete  . Coronary artery bypass graft  01/2008    x4; LIMA to LAD, SVG to diagonal, SVG to circumflex marginal, SVG to RCA with EVH of R lower extremity   . Cholecystectomy  10/14/2011    Procedure: LAPAROSCOPIC CHOLECYSTECTOMY WITH INTRAOPERATIVE CHOLANGIOGRAM;  Surgeon: Robyne Askew, MD;  Location: [facility] OR;  Service: General;  Laterality: N/A;  . Tee without cardioversion  02/07/2008    used intraoperatively w/ CABG to assess LV function,   . Cardiac catheterization  01/30/2008    severe 3 vessel disease, needing CABG, EF 40-45%  . Femoral-popliteal bypass graft Right 01/23/2013    Procedure: BYPASS GRAFT FEMORALTO ABOVE KNEE POPLITEAL ARTERY;  Surgeon: Sherren Kerns, MD;  Location: [facility] OR;  Service: Vascular;  Laterality: Right;  . Abdominal angiogram N/A 10/13/2012    Procedure: ABDOMINAL ANGIOGRAM;  Surgeon: Sherren Kerns, MD;  Location: [facility] CATH LAB;  Service: Cardiovascular;  Laterality: N/A;  Current Outpatient Prescriptions  Medication Sig Dispense Refill  . ADVAIR DISKUS 250-50 MCG/DOSE AEPB Inhale 1 puff into the lungs as needed. Prn    . amLODipine (NORVASC) 5 MG tablet TAKE 1 TABLET BY MOUTH DAILY 90 tablet 1  . amoxicillin-clavulanate (AUGMENTIN XR) 1000-62.5 MG per tablet     . aspirin 81 MG tablet Take 81 mg by mouth daily.    . cetirizine (ZYRTEC) 10 MG tablet Take 10 mg by mouth daily.    . citalopram (CELEXA) 40 MG tablet Take 40 mg by mouth daily.     Marland Kitchen. LORazepam (ATIVAN) 0.5 MG tablet Take 0.5 mg by  mouth every 8 (eight) hours as needed for anxiety.     . Omega-3 Fatty Acids (FISH OIL) 1000 MG CAPS Take 1 capsule by mouth 2 (two) times daily.     Marland Kitchen. omeprazole (PRILOSEC) 20 MG capsule Take 1 capsule (20 mg total) by mouth daily. 90 capsule 3  . telmisartan-hydrochlorothiazide (MICARDIS HCT) 40-12.5 MG per tablet TAKE 1 TABLET BY MOUTH DAILY 90 tablet 1  . ZETIA 10 MG tablet TAKE 1 TABLET BY MOUTH EVERY DAY (Patient taking differently: twice weekly) 90 tablet 1   No current facility-administered medications for this visit.    Allergies:    Allergies  Allergen Reactions  . Beta Adrenergic Blockers Other (See Comments)    Significant bradycardia  . Codeine Nausea Only    dizziness  . Statins Other (See Comments)    Muscle aches w/ statins    Social History:  The patient  reports that she has been smoking Cigarettes.  She has a 25 pack-year smoking history. She has never used smokeless tobacco. She reports that she drinks alcohol. She reports that she does not use illicit drugs.   Family History  Problem Relation Age of Onset  . Heart failure Mother   . Heart disease Mother   . Hyperlipidemia Mother   . Cancer Father     bone    ROS:  Please see the history of present illness.   Denies any fevers, chills, orthopnea, PN. +HA, +cough, +SOB with activity  All other systems reviewed and negative.   PHYSICAL EXAM: VS:  BP 102/52 mmHg  Pulse 68  Ht 5' 3.5" (1.613 m)  Wt 155 lb (70.308 kg)  BMI 27.02 kg/m2 Well nourished, well developed, in no acute distress HEENT: normal, [place]/AT, EOMI Neck: no JVD, normal carotid upstroke, left sided bruit Cardiac:  normal S1, S2; RRR; no murmurDistant heart sounds Lungs:  No active wheezing. Normal respiratory effort Abd: soft, nontender, no hepatomegaly, no bruits Ext: no edema, palpable distal pulses Skin: warm and dry GU: deferred Neuro: no focal abnormalities noted, AAO x 3  EKG:   01/14/14-sinus rhythm 61, nonspecific ST-T wave  changes, subtle T-wave inversion inferior leads and lateral leads, PVC. No significant change from prior-11/15 - NSR, NSSTW changes.   ASSESSMENT AND PLAN:  1. Old MI-status post RCA stent. Bypass. Doing well. Abnormal EKG noted by Tomi BambergerSusan Fuller earlier today. When compared to prior EKG, this is no change. Reassuring. 2. PNA - abx. Been sick since 2/16. Cough 3. Coronary artery disease-no active anginal symptoms. Aggressive secondary prevention. 4. Peripheral vascular disease-reviewed notes from Dr. Darrick PennaFields. Bypass surgery right lower extremity. Aspirin. Tobacco cessation. Exercise. Left-sided carotid bruit noted. He will be checking carotid ultrasounds. Secondary prevention. 5. Tobacco use-counseled on tobacco cessation. Encouraged use of the American Heart Association website. 6. Hypertension-very well controlled. Stop amlodipine.  7. Hyperlipidemia-Zetia 3  days a week. She has not been able to tolerate statins previously. Her primary is checking lipids soon. 8. 6 month f/u.  Signed, Donato Schultz, MD [facility]  05/28/2014 11:08 AM

## 2014-06-21 ENCOUNTER — Other Ambulatory Visit: Payer: Self-pay | Admitting: Cardiology

## 2014-08-16 DEATH — deceased

## 2014-09-04 ENCOUNTER — Encounter: Payer: Self-pay | Admitting: Cardiology

## 2014-09-05 ENCOUNTER — Ambulatory Visit: Payer: Medicare Other | Admitting: Family

## 2014-09-05 ENCOUNTER — Other Ambulatory Visit (HOSPITAL_COMMUNITY): Payer: Medicare Other

## 2014-09-05 ENCOUNTER — Encounter (HOSPITAL_COMMUNITY): Payer: Medicare Other

## 2015-03-06 ENCOUNTER — Ambulatory Visit: Payer: Medicare Other | Admitting: Vascular Surgery

## 2015-03-06 ENCOUNTER — Encounter (HOSPITAL_COMMUNITY): Payer: Medicare Other

## 2015-03-06 ENCOUNTER — Other Ambulatory Visit (HOSPITAL_COMMUNITY): Payer: Medicare Other
